# Patient Record
Sex: Female | Born: 1990
Health system: Southern US, Community
[De-identification: ages and names within clinical notes are randomized; demographics above are authoritative.]

## PROBLEM LIST (undated history)

## (undated) DIAGNOSIS — I10 Essential (primary) hypertension: Secondary | ICD-10-CM

## (undated) DIAGNOSIS — J309 Allergic rhinitis, unspecified: Secondary | ICD-10-CM

## (undated) DIAGNOSIS — J329 Chronic sinusitis, unspecified: Secondary | ICD-10-CM

## (undated) DIAGNOSIS — F99 Mental disorder, not otherwise specified: Secondary | ICD-10-CM

## (undated) DIAGNOSIS — R519 Headache, unspecified: Secondary | ICD-10-CM

## (undated) DIAGNOSIS — J45909 Unspecified asthma, uncomplicated: Secondary | ICD-10-CM

## (undated) DIAGNOSIS — F909 Attention-deficit hyperactivity disorder, unspecified type: Secondary | ICD-10-CM

## (undated) DIAGNOSIS — F419 Anxiety disorder, unspecified: Secondary | ICD-10-CM

## (undated) HISTORY — DX: Essential (primary) hypertension: I10

## (undated) HISTORY — DX: Allergic rhinitis, unspecified: J30.9

## (undated) HISTORY — DX: Unspecified asthma, uncomplicated: J45.909

## (undated) HISTORY — PX: TOOTH EXTRACTION: SUR596

## (undated) HISTORY — PX: OVARIAN CYST REMOVAL: SHX89

## (undated) HISTORY — DX: Headache, unspecified: R51.9

## (undated) HISTORY — DX: Chronic sinusitis, unspecified: J32.9

## (undated) HISTORY — DX: Mental disorder, not otherwise specified: F99

## (undated) HISTORY — PX: OTHER SURGICAL HISTORY: SHX169

---
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
## 2015-03-05 ENCOUNTER — Emergency Department (HOSPITAL_COMMUNITY)
Admission: EM | Admit: 2015-03-05 | Discharge: 2015-03-06 | Disposition: A | Discharge status: Home / Self Care | Payer: 59 | Attending: Emergency Medicine | Admitting: Emergency Medicine

## 2015-03-05 ENCOUNTER — Emergency Department (HOSPITAL_COMMUNITY): Payer: 59

## 2015-03-05 ENCOUNTER — Encounter (HOSPITAL_COMMUNITY): Payer: Self-pay

## 2015-03-05 DIAGNOSIS — S99912A Unspecified injury of left ankle, initial encounter: Secondary | ICD-10-CM | POA: Diagnosis not present

## 2015-03-05 DIAGNOSIS — Y9389 Activity, other specified: Secondary | ICD-10-CM | POA: Insufficient documentation

## 2015-03-05 DIAGNOSIS — Y998 Other external cause status: Secondary | ICD-10-CM | POA: Insufficient documentation

## 2015-03-05 DIAGNOSIS — T1490XA Injury, unspecified, initial encounter: Secondary | ICD-10-CM

## 2015-03-05 DIAGNOSIS — S199XXA Unspecified injury of neck, initial encounter: Secondary | ICD-10-CM | POA: Insufficient documentation

## 2015-03-05 DIAGNOSIS — S8991XA Unspecified injury of right lower leg, initial encounter: Secondary | ICD-10-CM | POA: Diagnosis not present

## 2015-03-05 DIAGNOSIS — S29001A Unspecified injury of muscle and tendon of front wall of thorax, initial encounter: Secondary | ICD-10-CM | POA: Diagnosis not present

## 2015-03-05 DIAGNOSIS — Y92410 Unspecified street and highway as the place of occurrence of the external cause: Secondary | ICD-10-CM | POA: Insufficient documentation

## 2015-03-05 DIAGNOSIS — R52 Pain, unspecified: Secondary | ICD-10-CM

## 2015-03-05 DIAGNOSIS — S3991XA Unspecified injury of abdomen, initial encounter: Secondary | ICD-10-CM | POA: Diagnosis not present

## 2015-03-05 LAB — COMPREHENSIVE METABOLIC PANEL
ALT: 31 U/L (ref 0–35)
AST: 29 U/L (ref 0–37)
Albumin: 3.9 g/dL (ref 3.5–5.2)
Alkaline Phosphatase: 72 U/L (ref 39–117)
Anion gap: 7 (ref 5–15)
BUN: 12 mg/dL (ref 6–23)
CO2: 25 mmol/L (ref 19–32)
Calcium: 8.9 mg/dL (ref 8.4–10.5)
Chloride: 106 mmol/L (ref 96–112)
Creatinine, Ser: 0.67 mg/dL (ref 0.50–1.10)
GFR calc Af Amer: >90 mL/min (ref >90)
GFR calc non Af Amer: >90 mL/min (ref >90)
Glucose, Bld: 112 mg/dL — ABNORMAL HIGH (ref 70–99)
Potassium: 3.5 mmol/L (ref 3.5–5.1)
Sodium: 138 mmol/L (ref 135–145)
Total Bilirubin: 0.6 mg/dL (ref 0.3–1.2)
Total Protein: 7.6 g/dL (ref 6.0–8.3)

## 2015-03-05 LAB — CBC
HCT: 39.4 % (ref 36.0–46.0)
Hemoglobin: 12.9 g/dL (ref 12.0–15.0)
MCH: 28.4 pg (ref 26.0–34.0)
MCHC: 32.7 g/dL (ref 30.0–36.0)
MCV: 86.8 fL (ref 78.0–100.0)
Platelets: 153 10*3/uL (ref 150–400)
RBC: 4.54 MIL/uL (ref 3.87–5.11)
RDW: 13.1 % (ref 11.5–15.5)
WBC: 10.9 10*3/uL — ABNORMAL HIGH (ref 4.0–10.5)

## 2015-03-05 LAB — PROTIME-INR
INR: 1.03 (ref 0.00–1.49)
Prothrombin Time: 13.6 seconds (ref 11.6–15.2)

## 2015-03-05 LAB — I-STAT BETA HCG BLOOD, ED (MC, WL, AP ONLY): I-stat hCG, quantitative: <5 m[IU]/mL (ref <5)

## 2015-03-05 LAB — SAMPLE TO BLOOD BANK

## 2015-03-05 LAB — ETHANOL: Alcohol, Ethyl (B): <5 mg/dL (ref 0–9)

## 2015-03-05 IMAGING — CR DG KNEE COMPLETE 4+V*R*
4 series · 4 of 4 positions shown · non-contrast
Comparison: None.

CLINICAL DATA: Medial knee pain

EXAM:
RIGHT KNEE - COMPLETE 4+ VIEW

[t knee ap right]
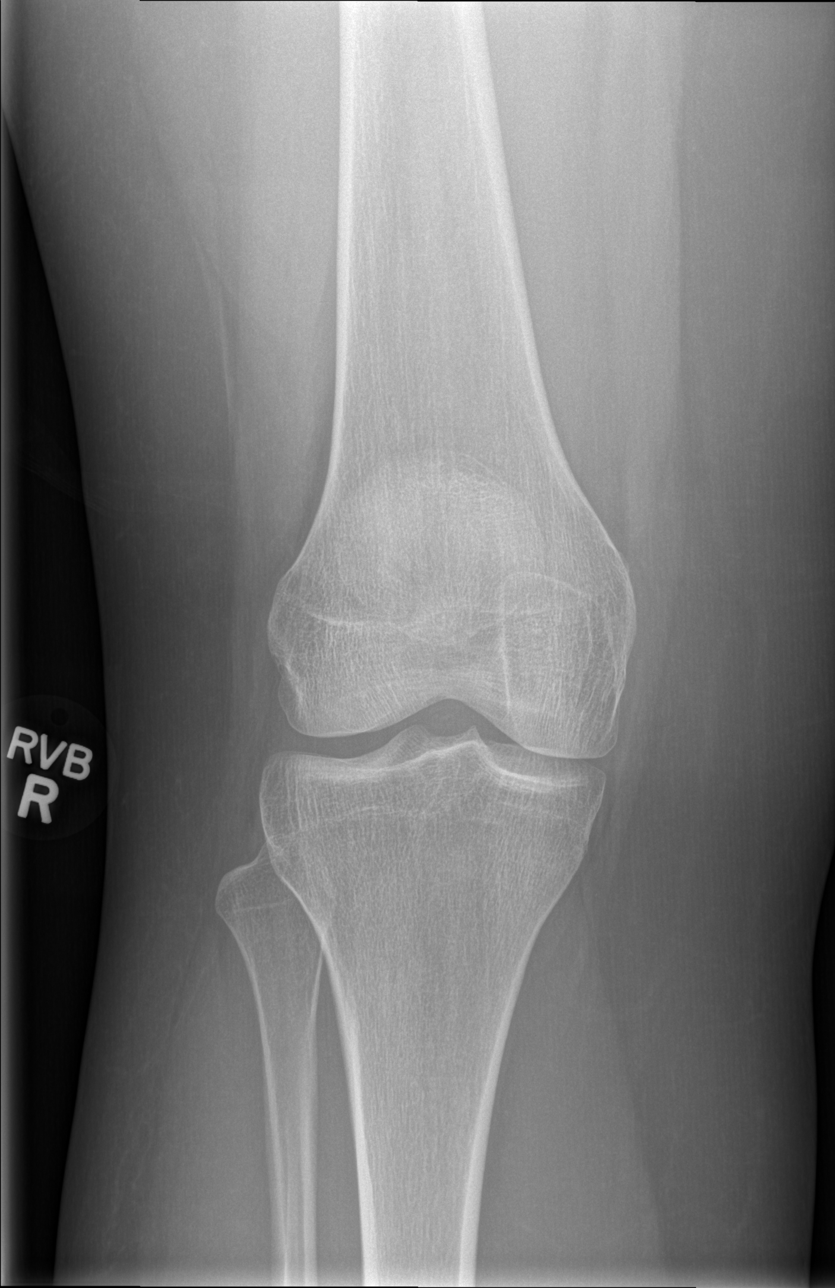

[t knee obl right (1 of 2)]
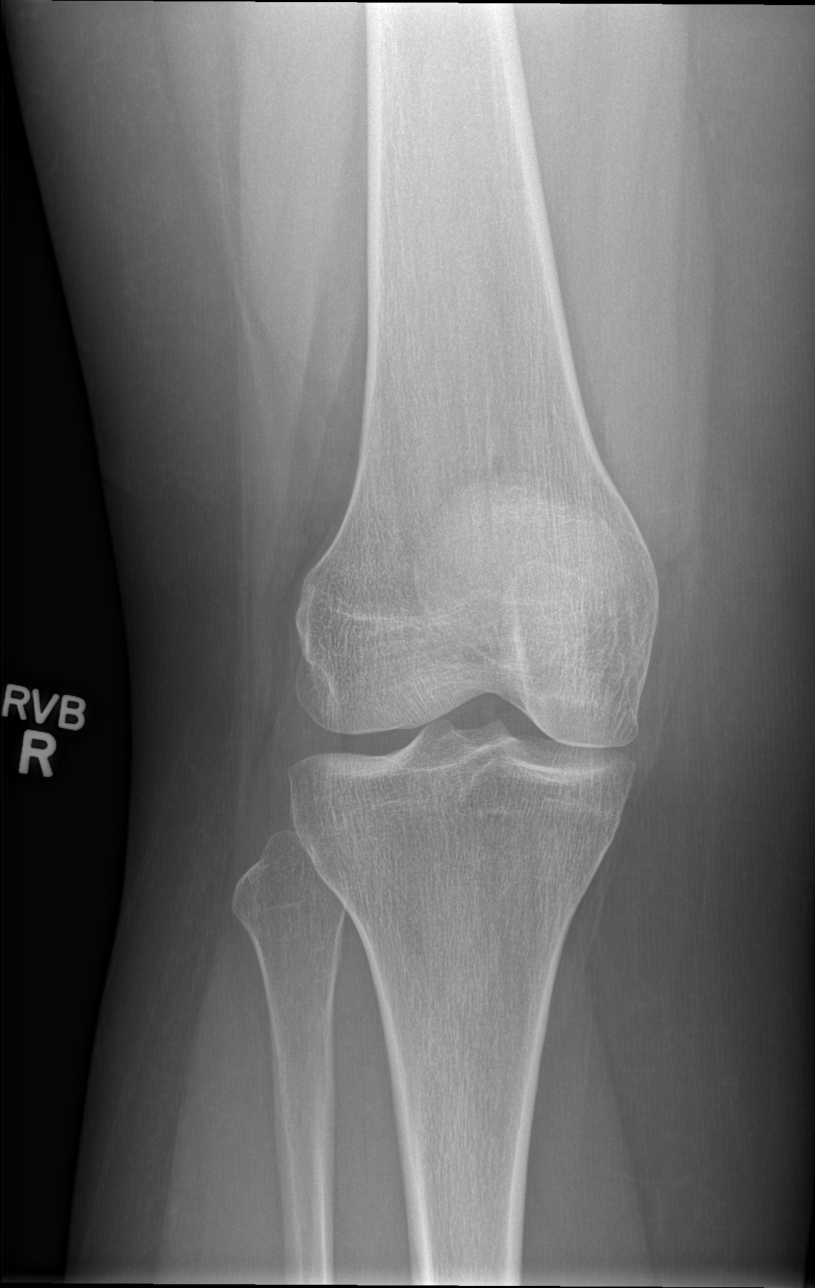

[t knee obl right (2 of 2)]
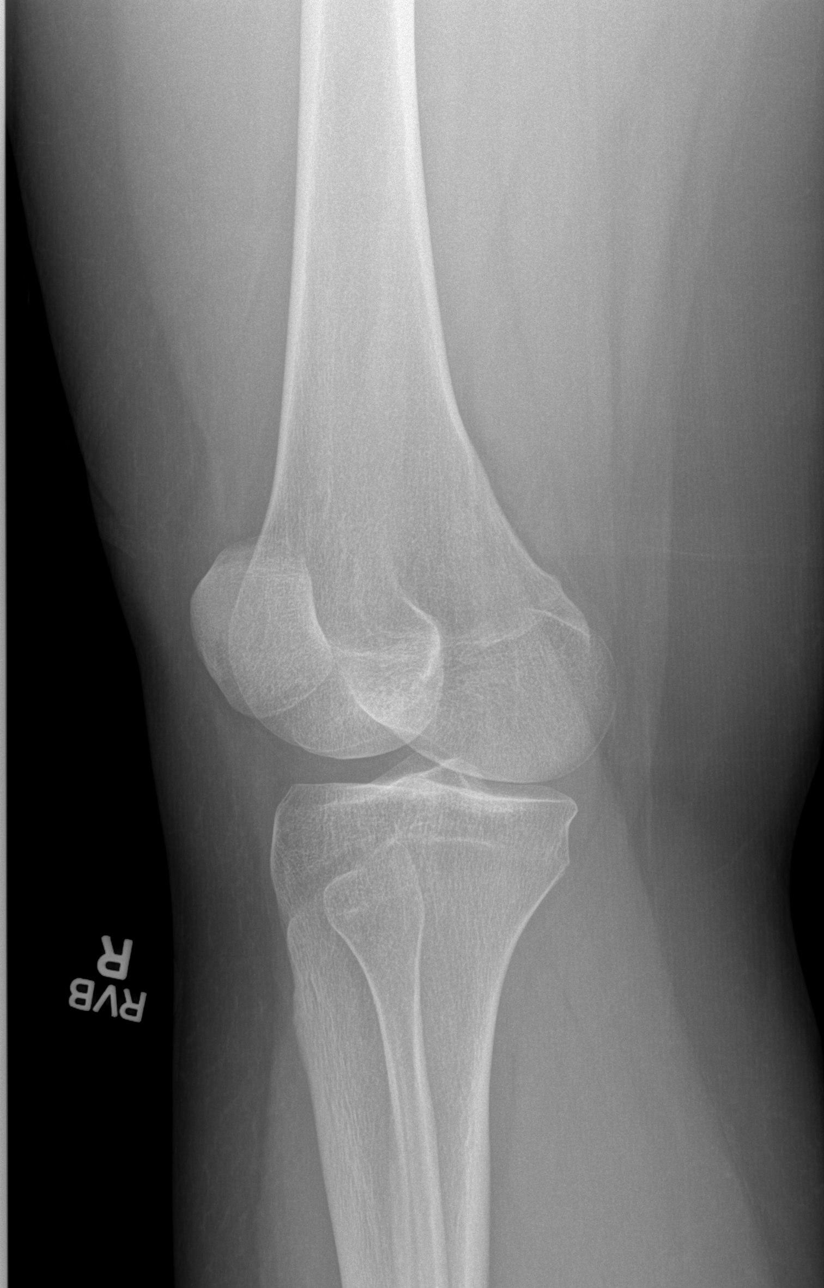

[t knee lat right]
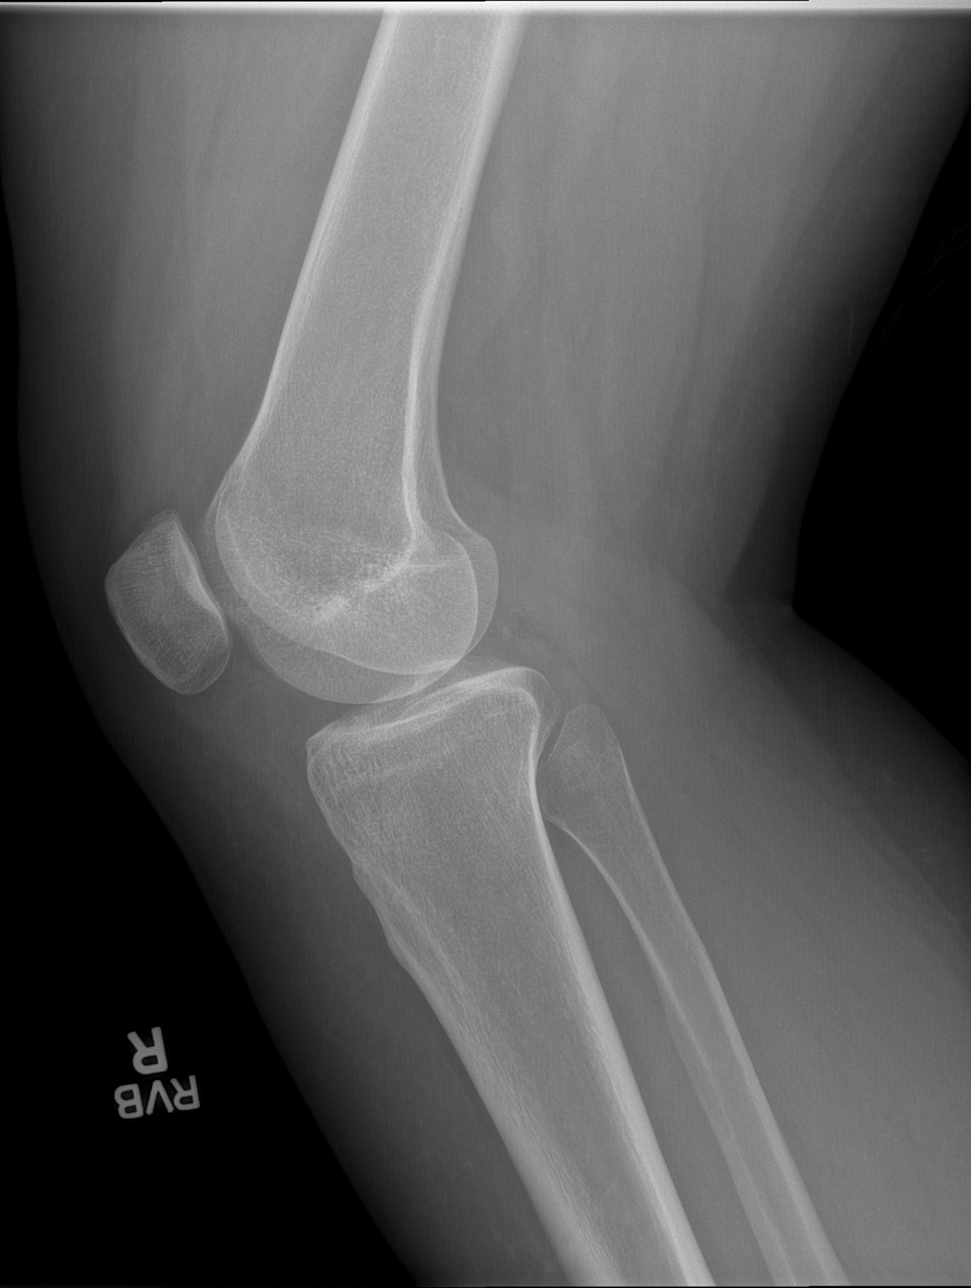

[4 of 4 positions shown; findings below may reference images not displayed]

FINDINGS: There is no evidence of fracture, dislocation, or joint effusion.
There is no evidence of arthropathy or other focal bone abnormality.
Soft tissues are unremarkable.
IMPRESSION: Negative.
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
## 2015-03-05 MED ORDER — IOHEXOL 300 MG/ML  SOLN
100.0000 mL | Freq: Once | INTRAMUSCULAR | Status: AC | PRN
  Administered 2015-03-05: 100 mL via INTRAVENOUS

## 2015-03-05 MED ORDER — HYDROMORPHONE HCL 1 MG/ML IJ SOLN
1.0000 mg | Freq: Once | INTRAMUSCULAR | Status: AC
  Administered 2015-03-05: 1 mg via INTRAVENOUS
  Filled 2015-03-05: qty 1
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
## 2015-03-05 NOTE — ED Notes (Signed)
Inquired as to why CTs have not been completed. CT tech states she was waiting on labs, labs has results and tech has been updated. Pt has been moved to front of the list.

## 2015-03-05 NOTE — ED Notes (Signed)
Patient transported to X-ray 

## 2015-03-06 MED ORDER — HYDROMORPHONE HCL 1 MG/ML IJ SOLN
1.0000 mg | Freq: Once | INTRAMUSCULAR | Status: AC
  Administered 2015-03-06: 1 mg via INTRAVENOUS
  Filled 2015-03-06: qty 1

## 2015-03-06 MED ORDER — CYCLOBENZAPRINE HCL 10 MG PO TABS: 10.0000 mg | ORAL_TABLET | Freq: Two times a day (BID) | ORAL | Status: DC | PRN

## 2015-03-06 MED ORDER — OXYCODONE-ACETAMINOPHEN 5-325 MG PO TABS: 2.0000 | ORAL_TABLET | Freq: Four times a day (QID) | ORAL | Status: DC | PRN

## 2015-03-06 NOTE — Discharge Instructions (Signed)

## 2016-02-20 ENCOUNTER — Other Ambulatory Visit (HOSPITAL_COMMUNITY): Payer: Self-pay | Admitting: Registered Nurse

## 2016-02-20 ENCOUNTER — Ambulatory Visit (HOSPITAL_COMMUNITY)
Admission: RE | Admit: 2016-02-20 | Discharge: 2016-02-20 | Disposition: A | Discharge status: Home / Self Care | Payer: 59 | Source: Ambulatory Visit | Attending: Registered Nurse | Admitting: Registered Nurse

## 2016-02-20 DIAGNOSIS — E6609 Other obesity due to excess calories: Secondary | ICD-10-CM | POA: Insufficient documentation

## 2016-02-20 DIAGNOSIS — M545 Low back pain, unspecified: Secondary | ICD-10-CM

## 2016-02-20 DIAGNOSIS — M542 Cervicalgia: Secondary | ICD-10-CM

## 2016-02-20 DIAGNOSIS — Z6831 Body mass index (BMI) 31.0-31.9, adult: Secondary | ICD-10-CM | POA: Insufficient documentation

## 2016-02-20 DIAGNOSIS — Z1389 Encounter for screening for other disorder: Secondary | ICD-10-CM | POA: Insufficient documentation

## 2016-02-20 DIAGNOSIS — G8929 Other chronic pain: Secondary | ICD-10-CM | POA: Diagnosis not present
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
## 2016-02-20 NOTE — Progress Notes (Signed)
Pt removed 2 earrings, 1 belly ring, and 1 tongue ring for imaging. Jewelry was placed in a Ziploc and Pt placed bag in her purse.

## 2016-03-18 DIAGNOSIS — Z1389 Encounter for screening for other disorder: Secondary | ICD-10-CM | POA: Diagnosis not present

## 2016-03-18 DIAGNOSIS — N342 Other urethritis: Secondary | ICD-10-CM | POA: Diagnosis not present

## 2016-03-18 DIAGNOSIS — B079 Viral wart, unspecified: Secondary | ICD-10-CM | POA: Diagnosis not present

## 2016-03-18 DIAGNOSIS — Z6832 Body mass index (BMI) 32.0-32.9, adult: Secondary | ICD-10-CM | POA: Diagnosis not present

## 2016-03-31 ENCOUNTER — Encounter: Payer: Self-pay | Admitting: Neurology

## 2016-03-31 ENCOUNTER — Ambulatory Visit (INDEPENDENT_AMBULATORY_CARE_PROVIDER_SITE_OTHER): Payer: 59 | Admitting: Neurology

## 2016-03-31 VITALS — BP 122/74 | HR 100 | Ht 66.0 in | Wt 204.3 lb

## 2016-03-31 DIAGNOSIS — M545 Low back pain, unspecified: Secondary | ICD-10-CM

## 2016-03-31 DIAGNOSIS — R51 Headache: Secondary | ICD-10-CM | POA: Diagnosis not present

## 2016-03-31 DIAGNOSIS — M542 Cervicalgia: Secondary | ICD-10-CM | POA: Diagnosis not present

## 2016-03-31 DIAGNOSIS — G4486 Cervicogenic headache: Secondary | ICD-10-CM

## 2016-03-31 MED ORDER — NORTRIPTYLINE HCL 10 MG PO CAPS: 10.0000 mg | ORAL_CAPSULE | Freq: Every day | ORAL | Status: DC

## 2016-03-31 NOTE — Patient Instructions (Signed)
1. For headaches, will start nortriptyline 10mg  at bedtime.  Call in 4 weeks with update and we can increase dose if needed. 2.  Limit use of ibuprofen or any pain relievers to no more than 2 days out of the week if possible to prevent rebound headache. 3.  Continue cyclobenzaprine 4.  Stop hydroxyzine. 5.  For neck and back pain, will refer you to Dr. Antoine Primas of Sports Medicine. 6.  Follow up in 3 months

## 2016-03-31 NOTE — Progress Notes (Signed)
Chart forwarded.  

## 2016-03-31 NOTE — Progress Notes (Signed)
NEUROLOGY CONSULTATION NOTE  Elizabeth Frank MRN: [id number] DOB: 11-Dec-1991  Referring provider: Dr. Sherwood Gambler Primary care provider: Dr. Sherwood Gambler  Reason for consult:  Back pain  HISTORY OF PRESENT ILLNESS: Elizabeth Frank is a 25 year old right-handed female who presents for back pain.  History obtained by patient and PCP note.  Imaging of cervical and lumbar radiographs reviewed.  She has longstanding history of low back pain.  Occasionally, it may radiate down the back of one of her legs, but usually it is midline.  Since January, she had low back pain that radiates up her spine and into her neck.  The pain is most prominent in the low back and at around the C7 level.  There is no numbness, weakness, or radicular pain in the extremities.  There is no bowel or bladder dysfunction, although she has an upcoming appointment with a urologist for frequent UTIs and urinary frequency.  The neck and back pain seem worse with prolonged inactivity, such as with standing, laying down or sitting.  Using a heating pad, ibuprofen or cyclobenzaprine helps.  When she gets the neck pain, she develops a headache radiating from the back of her head up to the front, bilaterally.  It is usually an ache but sometimes throbbing.  When severe, there may be photophobia or phonophobia but no nausea or visual disturbance.  It is about 6-7/10 intensity.  It lasts a couple of hours and occurs 4 to 5 times a week.  X-rays of the cervical and lumbar spines from 02/21/16 were normal.  She was prescribed a Medrol dose pack which ws ineffective.  Currently, he takes cyclobenzaprine 10mg  twice daily as needed and ibuprofen 800mg  three times daily as needed.  PAST MEDICAL HISTORY: History reviewed. No pertinent past medical history.  PAST SURGICAL HISTORY: History reviewed. No pertinent past surgical history.  MEDICATIONS: Current Outpatient Prescriptions on File Prior to Visit  Medication Sig Dispense Refill  . Cholecalciferol  (VITAMIN D PO) Take 1 tablet by mouth daily.     No current facility-administered medications on file prior to visit.    ALLERGIES: No Known Allergies  FAMILY HISTORY: History reviewed. No pertinent family history.  SOCIAL HISTORY: Social History   Social History  . Marital Status: Single    Spouse Name: N/A  . Number of Children: N/A  . Years of Education: N/A   Occupational History  . Not on file.   Social History Main Topics  . Smoking status: Never Smoker   . Smokeless tobacco: Not on file  . Alcohol Use: No  . Drug Use: No  . Sexual Activity: Not on file   Other Topics Concern  . Not on file   Social History Narrative    REVIEW OF SYSTEMS: Constitutional: No fevers, chills, or sweats, no generalized fatigue, change in appetite Eyes: No visual changes, double vision, eye pain Ear, nose and throat: No hearing loss, ear pain, nasal congestion, sore throat Cardiovascular: No chest pain, palpitations Respiratory:  No shortness of breath at rest or with exertion, wheezes GastrointestinaI: No nausea, vomiting, diarrhea, abdominal pain, fecal incontinence Genitourinary:  No dysuria, urinary retention or frequency Musculoskeletal:  No neck pain, back pain Integumentary: No rash, pruritus, skin lesions Neurological: as above Psychiatric: No depression, insomnia, anxiety Endocrine: No palpitations, fatigue, diaphoresis, mood swings, change in appetite, change in weight, increased thirst Hematologic/Lymphatic:  No anemia, purpura, petechiae. Allergic/Immunologic: no itchy/runny eyes, nasal congestion, recent allergic reactions, rashes  PHYSICAL EXAM: Filed Vitals:  03/31/16 1432  BP: 122/74  Pulse: 100   General: No acute distress.  Patient appears well-groomed.  Head:  Normocephalic/atraumatic Eyes:  fundi unremarkable, without vessel changes, exudates, hemorrhages or papilledema. Neck: supple, no paraspinal tenderness, mild tenderness to palpation of occipital  region bilaterally and in trapezius, full range of motion Back: bilateral paraspinal tenderness down the spine. Heart: regular rate and rhythm Lungs: Clear to auscultation bilaterally. Vascular: No carotid bruits. Neurological Exam: Mental status: alert and oriented to person, place, and time, recent and remote memory intact, fund of knowledge intact, attention and concentration intact, speech fluent and not dysarthric, language intact. Cranial nerves: CN I: not tested CN II: pupils equal, round and reactive to light, visual fields intact, fundi unremarkable, without vessel changes, exudates, hemorrhages or papilledema. CN III, IV, VI:  full range of motion, no nystagmus, no ptosis CN V: facial sensation intact CN VII: upper and lower face symmetric CN VIII: hearing intact CN IX, X: gag intact, uvula midline CN XI: sternocleidomastoid and trapezius muscles intact CN XII: tongue midline Bulk & Tone: normal, no fasciculations. Motor:  5/5 throughout  Sensation: temperature and vibration sensation intact. Deep Tendon Reflexes:  2+ throughout, toes downgoing.  Finger to nose testing:  Without dysmetria.  Heel to shin:  Without dysmetria.  Gait:  Normal station and stride.  Able to turn and tandem walk. Romberg negative.  IMPRESSION: 1.  Neck and back pain.  It does not appear neurologic, although low back pain occasionally associated with lumbar radiculopathy. 2.  Cervicogenic or tension-type headache, related to neck pain.  PLAN: 1. For frequent headaches, will start nortriptyline  at bedtime.  She will contact us in 4 weeks with update and we can adjust dose if needed. 2.  Advised to limit use of ibuprofen or all pain relievers to no more than 2 days out of the week to prevent rebound headache 3.  Cyclobenzaprine as needed 4.  I would like to refer her to Dr. Antoine Primas of Sports Medicine to evaluate and treat neck and back pain, which is the probable generator for her  headaches. 5.  She will stop Atarax as it is ineffective. 6.  Follow up in 3 months.  Thank you for allowing me to take part in the care of this patient.  Shon Millet, DO  CC:  Elfredia Nevins, MD

## 2016-04-27 ENCOUNTER — Telehealth: Payer: Self-pay | Admitting: Neurology

## 2016-04-27 NOTE — Telephone Encounter (Signed)
PT called and said she is still having headaches and also asking about a referral to sports medicine/Dawn CB# [phone]

## 2016-04-28 MED ORDER — NORTRIPTYLINE HCL 25 MG PO CAPS: 25.0000 mg | ORAL_CAPSULE | Freq: Every day | ORAL | Status: DC

## 2016-04-28 NOTE — Telephone Encounter (Signed)
Dr. Michaelle Copas contact number is [phone]

## 2016-04-28 NOTE — Telephone Encounter (Signed)
New RX sent in. Spoke w/ patient

## 2016-04-28 NOTE — Telephone Encounter (Signed)
Pt on nortriptyline10 mg. States she really can't tell too much of a difference with her headaches.  She is having no side effects from medication. It is however helping her mood. Pt would like to know if it can be increased. Please advise.

## 2016-04-28 NOTE — Telephone Encounter (Signed)
Yes, increase to  at bedtime and contact us in 4 weeks with update.

## 2016-05-21 DIAGNOSIS — Z6832 Body mass index (BMI) 32.0-32.9, adult: Secondary | ICD-10-CM | POA: Diagnosis not present

## 2016-05-21 DIAGNOSIS — J3089 Other allergic rhinitis: Secondary | ICD-10-CM | POA: Diagnosis not present

## 2016-05-21 DIAGNOSIS — B079 Viral wart, unspecified: Secondary | ICD-10-CM | POA: Diagnosis not present

## 2016-05-28 ENCOUNTER — Telehealth: Payer: Self-pay | Admitting: Neurology

## 2016-05-28 NOTE — Telephone Encounter (Signed)
Elizabeth Frank 07-15-1991. She was calling to update Dr. Everlena Cooper on her headaches. They are much better. Her back pain is also better. She said she still has some headaches, but no where near as bad as they were. Her number is [phone]. Thank you

## 2016-05-29 MED ORDER — NORTRIPTYLINE HCL 25 MG PO CAPS: 25.0000 mg | ORAL_CAPSULE | Freq: Every day | ORAL | Status: DC

## 2016-06-30 ENCOUNTER — Ambulatory Visit: Payer: BC Managed Care – PPO | Admitting: Neurology

## 2016-06-30 DIAGNOSIS — Z029 Encounter for administrative examinations, unspecified: Secondary | ICD-10-CM

## 2016-07-21 DIAGNOSIS — J343 Hypertrophy of nasal turbinates: Secondary | ICD-10-CM | POA: Diagnosis not present

## 2016-07-21 DIAGNOSIS — F419 Anxiety disorder, unspecified: Secondary | ICD-10-CM | POA: Diagnosis not present

## 2016-07-21 DIAGNOSIS — Z6832 Body mass index (BMI) 32.0-32.9, adult: Secondary | ICD-10-CM | POA: Diagnosis not present

## 2016-07-21 DIAGNOSIS — J811 Chronic pulmonary edema: Secondary | ICD-10-CM | POA: Diagnosis not present

## 2016-08-26 ENCOUNTER — Encounter: Payer: Self-pay | Admitting: Neurology

## 2016-08-26 ENCOUNTER — Ambulatory Visit (INDEPENDENT_AMBULATORY_CARE_PROVIDER_SITE_OTHER): Payer: BC Managed Care – PPO | Admitting: Neurology

## 2016-08-26 VITALS — BP 116/80 | HR 103 | Ht 66.0 in | Wt 205.9 lb

## 2016-08-26 DIAGNOSIS — G44219 Episodic tension-type headache, not intractable: Secondary | ICD-10-CM

## 2016-08-26 NOTE — Progress Notes (Signed)
Chart forwarded.  

## 2016-08-26 NOTE — Patient Instructions (Signed)
Increased headaches likely related to going back to school/stress 1. Continue the nortriptyline 25mg  at bedtime for now.  If they don't improve, contact me and we can increase the nortriptyline.  2.  Limit use of ibuprofen to no more than 2 days out of the week 3.  Hydrate 4.  Exercise 5.  Follow up in 5 months.

## 2016-08-26 NOTE — Progress Notes (Signed)
NEUROLOGY FOLLOW UP OFFICE NOTE  Elizabeth M Waterhouse [id number]  HISTORY OF PRESENT ILLNESS: Elizabeth Frank is a 25 year old right-handed female who follows up for headache and neck and back pain.  UPDATE: For headache, she is taking nortriptyline 25mg  at bedtime.  Headaches improved significantly.  They usually respond to ibuprofen quickly and were occurring about 2 days per week.  Neck pain improved with the headaches as well.  She does stretching exercises  She was referred to Dr. Katrinka Blazing for OMM, but they had improved, so she didn't need to make the appointment.  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  Since then, the headaches have increased in frequency, now about 3 to 4 days per week.    She sleeps well.  She walks and stretches.  She tries to keep hydrated.  HISTORY: She has longstanding history of low back pain.  Occasionally, it may radiate down the back of one of her legs, but usually it is midline.  Since January, she had low back pain that radiates up her spine and into her neck.  The pain is most prominent in the low back and at around the C7 level.  There is no numbness, weakness, or radicular pain in the extremities.  There is no bowel or bladder dysfunction, although she has an upcoming appointment with a urologist for frequent UTIs and urinary frequency.  The neck and back pain seem worse with prolonged inactivity, such as with standing, laying down or sitting.  Using a heating pad, ibuprofen or cyclobenzaprine helps.  When she gets the neck pain, she develops a headache radiating from the back of her head up to the front, bilaterally.  It is usually an ache but sometimes throbbing.  When severe, there may be photophobia or phonophobia but no nausea or visual disturbance.  It is about 6-7/10 intensity.  It lasts a couple of hours and occurs 4 to 5 times a week.  X-rays of the cervical and lumbar spines from 02/21/16 were normal.  PAST MEDICAL HISTORY: No past medical  history on file.  MEDICATIONS: Current Outpatient Prescriptions on File Prior to Visit  Medication Sig Dispense Refill  . ibuprofen (ADVIL,MOTRIN) 800 MG tablet Take 800 mg by mouth every 8 (eight) hours as needed.    . nortriptyline (PAMELOR) 25 MG capsule Take 1 capsule (25 mg total) by mouth at bedtime. 30 capsule 1  . Cholecalciferol (VITAMIN D PO) Take 1 tablet by mouth daily.    . cyclobenzaprine (FLEXERIL) 10 MG tablet Take 10 mg by mouth 3 (three) times daily as needed for muscle spasms.    . hydrOXYzine (ATARAX/VISTARIL) 25 MG tablet Take 25 mg by mouth 3 (three) times daily as needed.     No current facility-administered medications on file prior to visit.     ALLERGIES: No Known Allergies  FAMILY HISTORY: No family history noted.  SOCIAL HISTORY: Social History   Social History  . Marital status: Single    Spouse name: N/A  . Number of children: N/A  . Years of education: N/A   Occupational History  . Not on file.   Social History Main Topics  . Smoking status: Never Smoker  . Smokeless tobacco: Not on file  . Alcohol use No  . Drug use: No  . Sexual activity: Not on file   Other Topics Concern  . Not on file   Social History Narrative  . No narrative on file  REVIEW OF SYSTEMS: Constitutional: No fevers, chills, or sweats, no generalized fatigue, change in appetite Eyes: No visual changes, double vision, eye pain Ear, nose and throat: No hearing loss, ear pain, nasal congestion, sore throat Cardiovascular: No chest pain, palpitations Respiratory:  No shortness of breath at rest or with exertion, wheezes GastrointestinaI: No nausea, vomiting, diarrhea, abdominal pain, fecal incontinence Genitourinary:  No dysuria, urinary retention or frequency Musculoskeletal:  No neck pain, back pain Integumentary: No rash, pruritus, skin lesions Neurological: as above Psychiatric: No depression, insomnia, anxiety Endocrine: No palpitations, fatigue,  diaphoresis, mood swings, change in appetite, change in weight, increased thirst Hematologic/Lymphatic:  No purpura, petechiae. Allergic/Immunologic: no itchy/runny eyes, nasal congestion, recent allergic reactions, rashes  PHYSICAL EXAM: Vitals:   08/26/16 1426  BP: 116/80  Pulse: (!) 103   General: No acute distress.  Patient appears well-groomed.  Head:  Normocephalic/atraumatic Eyes:  Fundi examined but not visualized Neck: supple, no paraspinal tenderness, full range of motion Heart:  Regular rate and rhythm Lungs:  Clear to auscultation bilaterally Back: No paraspinal tenderness Neurological Exam: alert and oriented to person, place, and time. Attention span and concentration intact, recent and remote memory intact, fund of knowledge intact.  Speech fluent and not dysarthric, language intact.  CN II-XII intact. Bulk and tone normal, muscle strength 5/5 throughout.  Sensation to light touch, temperature and vibration intact.  Deep tendon reflexes 2+ throughout, toes downgoing.  Finger to nose and heel to shin testing intact.  Gait normal, Romberg negative.  IMPRESSION: Tension type headache.  Recently increased frequency due to stress from going back to school.  PLAN: 1.  She will continue nortriptyline 25mg  at bedtime for now.  If headaches don't improve, she will contact us and we can increase dose to 50mg  at bedtime. 2.  Advised to limit use of ibuprofen to no more than 2 days out of the week 3.  Stay hydrated, exercise 4.  Follow up in 5 months.  15 minutes spent face to face with patient, over 50% spent counseling.  Shon MilletAdam Shaday Rayborn, DO  CC:  Elfredia NevinsLawrence Fusco, MD

## 2016-09-04 ENCOUNTER — Telehealth: Payer: Self-pay

## 2016-09-04 MED ORDER — NORTRIPTYLINE HCL 50 MG PO CAPS: 50.0000 mg | ORAL_CAPSULE | Freq: Every day | ORAL | 3 refills | Status: DC

## 2016-09-04 NOTE — Telephone Encounter (Signed)
1.  She will continue nortriptyline 25mg  at bedtime for now.  If headaches don't improve, she will contact us and we can increase dose to 50mg  at bedtime.

## 2016-09-24 ENCOUNTER — Other Ambulatory Visit: Payer: Self-pay

## 2016-09-24 MED ORDER — NORTRIPTYLINE HCL 50 MG PO CAPS: 50.0000 mg | ORAL_CAPSULE | Freq: Every day | ORAL | 1 refills | Status: DC

## 2016-09-24 NOTE — Telephone Encounter (Signed)
Pt called to switch pharmacy to mail order. New Rx sent in.

## 2016-11-06 DIAGNOSIS — J343 Hypertrophy of nasal turbinates: Secondary | ICD-10-CM | POA: Diagnosis not present

## 2016-11-06 DIAGNOSIS — J029 Acute pharyngitis, unspecified: Secondary | ICD-10-CM | POA: Diagnosis not present

## 2016-11-06 DIAGNOSIS — Z6836 Body mass index (BMI) 36.0-36.9, adult: Secondary | ICD-10-CM | POA: Diagnosis not present

## 2016-11-06 DIAGNOSIS — Z1389 Encounter for screening for other disorder: Secondary | ICD-10-CM | POA: Diagnosis not present

## 2016-11-06 DIAGNOSIS — Z0001 Encounter for general adult medical examination with abnormal findings: Secondary | ICD-10-CM | POA: Diagnosis not present

## 2016-11-06 DIAGNOSIS — J020 Streptococcal pharyngitis: Secondary | ICD-10-CM | POA: Diagnosis not present

## 2016-11-06 DIAGNOSIS — E6609 Other obesity due to excess calories: Secondary | ICD-10-CM | POA: Diagnosis not present

## 2017-01-14 DIAGNOSIS — J9801 Acute bronchospasm: Secondary | ICD-10-CM | POA: Diagnosis not present

## 2017-01-14 DIAGNOSIS — E6609 Other obesity due to excess calories: Secondary | ICD-10-CM | POA: Diagnosis not present

## 2017-01-14 DIAGNOSIS — Z6835 Body mass index (BMI) 35.0-35.9, adult: Secondary | ICD-10-CM | POA: Diagnosis not present

## 2017-01-14 DIAGNOSIS — Z1389 Encounter for screening for other disorder: Secondary | ICD-10-CM | POA: Diagnosis not present

## 2017-01-14 DIAGNOSIS — J329 Chronic sinusitis, unspecified: Secondary | ICD-10-CM | POA: Diagnosis not present

## 2017-01-14 DIAGNOSIS — J209 Acute bronchitis, unspecified: Secondary | ICD-10-CM | POA: Diagnosis not present

## 2017-01-26 ENCOUNTER — Ambulatory Visit: Payer: BC Managed Care – PPO | Admitting: Neurology

## 2017-01-28 ENCOUNTER — Encounter: Payer: Self-pay | Admitting: Neurology

## 2017-07-12 ENCOUNTER — Other Ambulatory Visit: Payer: Self-pay | Admitting: Neurology

## 2017-07-14 ENCOUNTER — Telehealth: Payer: Self-pay | Admitting: Neurology

## 2017-07-14 ENCOUNTER — Other Ambulatory Visit: Payer: Self-pay | Admitting: *Deleted

## 2017-07-14 MED ORDER — NORTRIPTYLINE HCL 50 MG PO CAPS: 50.0000 mg | ORAL_CAPSULE | Freq: Every day | ORAL | 0 refills | Status: DC

## 2017-07-14 NOTE — Telephone Encounter (Signed)
Rx sent for a 30 day supply.  No refills until after patient is seen in office.

## 2017-07-14 NOTE — Telephone Encounter (Signed)
PT called and said she needs a refill if her nortriptyline called in

## 2017-07-15 ENCOUNTER — Encounter: Payer: Self-pay | Admitting: Neurology

## 2017-07-15 ENCOUNTER — Ambulatory Visit (INDEPENDENT_AMBULATORY_CARE_PROVIDER_SITE_OTHER): Payer: BC Managed Care – PPO | Admitting: Neurology

## 2017-07-15 VITALS — BP 110/68 | HR 98 | Ht 66.0 in | Wt 234.0 lb

## 2017-07-15 DIAGNOSIS — G44219 Episodic tension-type headache, not intractable: Secondary | ICD-10-CM | POA: Diagnosis not present

## 2017-07-15 NOTE — Patient Instructions (Signed)
1.  Continue nortriptyline 50mg  at bedtime 2.  Take ibuprofen as needed, limited to no more than 2 days out of the week to prevent rebound headache 3.  Increase water intake 4.  Increase exercise (strive for goal of at least 30 minutes 4 days per week) 5.  Consider riboflavin 400mg  daily (over the counter but may need to get on [employer]) and CoQ10 100mg  three times daily 6.  Follow up in one year

## 2017-07-15 NOTE — Progress Notes (Signed)
NEUROLOGY FOLLOW UP OFFICE NOTE  Elizabeth Frank [id number]  HISTORY OF PRESENT ILLNESS: Elizabeth Frank is a 26 year old right-handed female who follows up for episodic tension type headache.   UPDATE: Headaches are well-controlled. Intensity:  4-5/10 Duration:  30 minutes with ibuprofen Frequency:  4 to 5 days per month Current abortive medication:  Ibuprofen 200mg  Current preventative medication:  nortriptyline 50mg  at bedtime   She sleeps well.  She still walks and stretches but needs to improve.  She tries to keep hydrated.   HISTORY: She has longstanding history of neck and back pain.  When she gets the neck pain, she develops a headache radiating from the back of her head up to the front, bilaterally.  It is usually an ache but sometimes throbbing.  When severe, there may be photophobia or phonophobia but no nausea or visual disturbance.  It is about 6-7/10 intensity.  Initially, it lasts a couple of hours and occurs 4 to 5 times a week.  No specific triggers.  Past medication:  Ibuprofen 800mg , cyclobenzaprine   X-rays of the cervical and lumbar spines from 02/21/16 were normal..  PAST MEDICAL HISTORY: No past medical history on file.  MEDICATIONS: Current Outpatient Prescriptions on File Prior to Visit  Medication Sig Dispense Refill  . cyclobenzaprine (FLEXERIL) 10 MG tablet Take 10 mg by mouth 3 (three) times daily as needed for muscle spasms.    . nortriptyline (PAMELOR) 50 MG capsule Take 1 capsule (50 mg total) by mouth at bedtime. 30 capsule 0   No current facility-administered medications on file prior to visit.     ALLERGIES: No Known Allergies  FAMILY HISTORY: No family history on file.  SOCIAL HISTORY: Social History   Social History  . Marital status: Single    Spouse name: N/A  . Number of children: N/A  . Years of education: N/A   Occupational History  . Not on file.   Social History Main Topics  . Smoking status: Never Smoker  . Smokeless  tobacco: Never Used  . Alcohol use No  . Drug use: No  . Sexual activity: Not on file   Other Topics Concern  . Not on file   Social History Narrative  . No narrative on file    REVIEW OF SYSTEMS: Constitutional: No fevers, chills, or sweats, no generalized fatigue, change in appetite Eyes: No visual changes, double vision, eye pain Ear, nose and throat: No hearing loss, ear pain, nasal congestion, sore throat Cardiovascular: No chest pain, palpitations Respiratory:  No shortness of breath at rest or with exertion, wheezes GastrointestinaI: No nausea, vomiting, diarrhea, abdominal pain, fecal incontinence Genitourinary:  No dysuria, urinary retention or frequency Musculoskeletal:  No neck pain, back pain Integumentary: No rash, pruritus, skin lesions Neurological: as above Psychiatric: No depression, insomnia, anxiety Endocrine: No palpitations, fatigue, diaphoresis, mood swings, change in appetite, change in weight, increased thirst Hematologic/Lymphatic:  No purpura, petechiae. Allergic/Immunologic: no itchy/runny eyes, nasal congestion, recent allergic reactions, rashes  PHYSICAL EXAM: Vitals:   07/15/17 0839  BP: 110/68  Pulse: 98   General: No acute distress.  Patient appears well-groomed.   Head:  Normocephalic/atraumatic Eyes:  Fundi examined but not visualized Neck: supple, no paraspinal tenderness, full range of motion Heart:  Regular rate and rhythm Lungs:  Clear to auscultation bilaterally Back: No paraspinal tenderness Neurological Exam: alert and oriented to person, place, and time. Attention span and concentration intact, recent and remote memory intact, fund of knowledge intact.  Speech  fluent and not dysarthric, language intact.  CN II-XII intact. Bulk and tone normal, muscle strength 5/5 throughout.  Sensation to light touch, temperature and vibration intact.  Deep tendon reflexes 2+ throughout, toes downgoing.  Finger to nose and heel to shin testing intact.   Gait normal, Romberg negative.  IMPRESSION: Episodic tension type headache  PLAN: 1.  Refilled nortriptyline 50mg  at bedtime 2.  Take ibuprofen as needed, limited to no more than 2 days out of the week to prevent rebound headache 3.  Increase water intake 4.  Increase exercise (strive for goal of at least 30 minutes 4 days per week) 5.  Consider riboflavin 400mg  daily (over the counter but may need to get on [employer]) and CoQ10 100mg  three times daily 6.  Follow up in one year  If at any point she wishes to discontinue nortriptyline (as she may no longer need it), she should contact us.   Shon Millet, DO  CC:  [facility]

## 2017-08-23 ENCOUNTER — Other Ambulatory Visit: Payer: Self-pay | Admitting: Neurology

## 2018-04-15 ENCOUNTER — Encounter: Payer: Self-pay | Admitting: Neurology

## 2018-04-19 ENCOUNTER — Other Ambulatory Visit: Payer: Self-pay | Admitting: Neurology

## 2018-07-15 ENCOUNTER — Other Ambulatory Visit: Payer: Self-pay

## 2018-07-15 ENCOUNTER — Ambulatory Visit: Payer: BC Managed Care – PPO | Admitting: Neurology

## 2019-03-17 ENCOUNTER — Telehealth: Payer: Self-pay
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
## 2019-03-17 NOTE — Telephone Encounter (Signed)
Needs to be seen.  I haven't seen her in 1 1/2 years

## 2019-03-17 NOTE — Telephone Encounter (Signed)
Pt being scheduled for e-visit

## 2019-03-19 NOTE — Progress Notes (Signed)
Virtual Visit via Video Note The purpose of this virtual visit is to provide medical care while limiting exposure to the novel coronavirus.    Consent was obtained for video visit:  Yes.   Answered questions that patient had about telehealth interaction:  Yes.   I discussed the limitations, risks, security and privacy concerns of performing an evaluation and management service by telemedicine. I also discussed with the patient that there may be a patient responsible charge related to this service. The patient expressed understanding and agreed to proceed.  Pt location: Home Physician Location: office Name of referring provider:  [facility], [facility] I connected with Elizabeth Frank at patients initiation/request on 03/20/2019 at  1:00 PM EDT by video enabled telemedicine application and verified that I am speaking with the correct person using two identifiers. Pt MRN:  [id number] Pt DOB:  May 09, 1991   History of Present Illness:  Elizabeth Schiff is a 28 year old right-handed female who follows up for episodic tension type headache.  UPDATE: Patient last seen in July 2018.  Headaches had been well-controlled.  She self-discontinued nortriptyline last summer since the headaches had resolved.  In September, she had increased emotional stress after she broke up with her boyfriend.  Since then, she reports increased frequency of headaches, including migraines.  Migraines are 8-9/10 intensity and associated with blurred vision, photophobia, phonophobia and sometimes nausea (no vomiting or unilateral numbness or weakness).  They typically last all day.  Her typical headaches are 6-7/10, last 2 hours if treated but may last up to all day.  She has had 25 headache days in past 30 days, about 4 were what she calls "migraines".  She has been treating headaches with ibuprofen or Goody powder about 3 to 4 days a week.  She has had increased neck and back pain as well.  Current abortive medication:   Ibuprofen 200mg , Goody powders Current preventative medication:  none Birth control:  Tri-Estarylla  She sleeps well.  She still walks and stretches but needs to improve.  She tries to keep hydrated.  HISTORY: She has longstanding history of neck and back pain. When she gets the neck pain, she develops a headache radiating from the back of her head up to the front, bilaterally. It is usually an ache but sometimes throbbing. When severe, there may be photophobia or phonophobia but no nausea or visual disturbance. It is about 6-7/10 intensity. Initially, it lasts a couple of hours and occurs 4 to 5 times a week.  No specific triggers.  Past medication:  Ibuprofen 800mg , cyclobenzaprine  X-rays of the cervical and lumbar spines from 02/21/16 were normal..    Observations/Objective:   Weight 236 lb (107 kg). alert and oriented to person, place, and time. Attention span and concentration intact, recent and remote memory intact, fund of knowledge intact.  Speech fluent and not dysarthric, language intact.  Pupils equal and round.  Eyes orthophoric on primary gaze and move in all directions.  Face symmetric.  Facial sensation intact.  Tongue midline.  No pronator drift.  Moves all extremities against gravity.  Finger to nose testing intact.  Gait normal, Romberg negative.  Assessment and Plan:   Chronic migraine without aura, without status migrainosus, not intractable Cervicalgia Lumbago  1.  For preventative management, restart nortriptyline 25mg  at bedtime for 1 week, then increase to 50mg  at bedtime.  She will contact us in 5 weeks with update.  If not improved, increase dose to 75mg  at bedtime  2.  For abortive therapy, she may try rizatriptan 10mg .  She may use ibuprofen or Excedrin for more mild headaches. 3.  Limit use of pain relievers to no more than 2 days out of week to prevent risk of rebound or medication-overuse headache. 4. She may take tizanidine 2mg  up to 3 times daily as  needed for neck and back pain.  She was cautioned about drowsiness.  She will continue her stretches. 5.  Keep headache diary 6.  Exercise, hydration, caffeine cessation, sleep hygiene, monitor for and avoid triggers 7.  Consider:  magnesium citrate 400mg  daily, riboflavin 400mg  daily, and coenzyme Q10 100mg  three times daily 8.  Follow up in 4 months.   Follow Up Instructions:    -I discussed the assessment and treatment plan with the patient. The patient was provided an opportunity to ask questions and all were answered. The patient agreed with the plan and demonstrated an understanding of the instructions.   The patient was advised to call back or seek an in-person evaluation if the symptoms worsen or if the condition fails to improve as anticipated.    Total Time spent in visit with the patient was:  19 minutes, of which more than 50% of the time was spent in counseling and/or coordinating care on above.   Pt understands and agrees with the plan of care outlined.     Cira Servant, DO

## 2019-03-20 ENCOUNTER — Encounter: Payer: Self-pay | Admitting: Neurology

## 2019-03-20 ENCOUNTER — Telehealth (INDEPENDENT_AMBULATORY_CARE_PROVIDER_SITE_OTHER): Payer: BC Managed Care – PPO | Admitting: Neurology

## 2019-03-20 ENCOUNTER — Other Ambulatory Visit: Payer: Self-pay

## 2019-03-20 VITALS — Wt 236.0 lb

## 2019-03-20 DIAGNOSIS — G8929 Other chronic pain: Secondary | ICD-10-CM | POA: Diagnosis not present

## 2019-03-20 DIAGNOSIS — M542 Cervicalgia: Secondary | ICD-10-CM

## 2019-03-20 DIAGNOSIS — G43709 Chronic migraine without aura, not intractable, without status migrainosus: Secondary | ICD-10-CM | POA: Diagnosis not present

## 2019-03-20 DIAGNOSIS — M545 Low back pain: Secondary | ICD-10-CM

## 2019-03-20 MED ORDER — NORTRIPTYLINE HCL 25 MG PO CAPS: ORAL_CAPSULE | ORAL | 0 refills | Status: DC

## 2019-03-20 MED ORDER — RIZATRIPTAN BENZOATE 10 MG PO TBDP: ORAL_TABLET | ORAL | 3 refills | Status: DC

## 2019-03-20 MED ORDER — NORTRIPTYLINE HCL 25 MG PO CAPS: 25.0000 mg | ORAL_CAPSULE | Freq: Every day | ORAL | 3 refills | Status: DC

## 2019-03-20 MED ORDER — TIZANIDINE HCL 2 MG PO TABS: 2.0000 mg | ORAL_TABLET | Freq: Three times a day (TID) | ORAL | 3 refills | Status: DC | PRN

## 2019-03-20 NOTE — Patient Instructions (Signed)
1.  Start nortriptyline 25mg  capsule.  Take 1 capsule at bedtime for 7 days, then increase to 2 capsules at bedtime.  At end of prescription, contact me for refill and whether we need to increase dose. 2.  At earliest onset of migraine, take rizatriptan 10mg .  May repeat dose once after 2 hours if needed (maximum 2 tablets in 24 hours) 3.  Limit use of pain relievers to no more than 2 days out of week to prevent risk of rebound or medication-overuse headache. 4.  Take tizanidine 2mg  for neck and back pain.  May take up to 3 times daily as needed.  Caution for drowsiness. 5.  Keep headache diary 6.  Stop all caffeine.  Continue drinking plenty of water. 7.  Follow up in 4 months.

## 2019-04-26 ENCOUNTER — Other Ambulatory Visit: Payer: Self-pay

## 2019-04-26 ENCOUNTER — Ambulatory Visit (HOSPITAL_COMMUNITY)
Admission: EM | Admit: 2019-04-26 | Discharge: 2019-04-26 | Disposition: A | Discharge status: Home / Self Care | Payer: BC Managed Care – PPO | Attending: Family Medicine | Admitting: Family Medicine

## 2019-04-26 ENCOUNTER — Encounter (HOSPITAL_COMMUNITY): Payer: Self-pay

## 2019-04-26 DIAGNOSIS — J029 Acute pharyngitis, unspecified: Secondary | ICD-10-CM | POA: Diagnosis present

## 2019-04-26 DIAGNOSIS — N390 Urinary tract infection, site not specified: Secondary | ICD-10-CM | POA: Diagnosis present

## 2019-04-26 LAB — POCT URINALYSIS DIP (DEVICE)
Bilirubin Urine: NEGATIVE
Glucose, UA: NEGATIVE mg/dL
Ketones, ur: NEGATIVE mg/dL
Nitrite: NEGATIVE
Protein, ur: NEGATIVE mg/dL
Specific Gravity, Urine: 1.025 (ref 1.005–1.030)
Urobilinogen, UA: 0.2 mg/dL (ref 0.0–1.0)
pH: 7 (ref 5.0–8.0)

## 2019-04-26 LAB — POCT RAPID STREP A: Streptococcus, Group A Screen (Direct): NEGATIVE

## 2019-04-26 LAB — POCT PREGNANCY, URINE: Preg Test, Ur: NEGATIVE

## 2019-04-26 MED ORDER — CEPHALEXIN 500 MG PO CAPS: 500.0000 mg | ORAL_CAPSULE | Freq: Two times a day (BID) | ORAL | 0 refills | Status: AC

## 2019-04-26 NOTE — ED Notes (Signed)
Patient able to ambulate independently  

## 2019-04-26 NOTE — ED Provider Notes (Signed)
[facility]    CSN: [id number] Arrival date & time: 04/26/19  [id number]     History   Chief Complaint Chief Complaint  Patient presents with  . Urinary Tract Infection  . Rash  . Nausea    HPI Elizabeth Frank is a 28 y.o. female.   Elizabeth Frank presents with complaints of urinary frequency, urgency and pain with urination. Started 4/30. Has been taking AZO  Which has helped some. Yesterday had some back and abdominal pain, no current. No fevers. No blood in urine. She is on birth control. Has had increased than normal white vaginal discharge, no odor pain or itching. Throat has been red and itching. She tells nursing after my exam that she is worried about chlamydia. No fevers. No cough or URI symptoms. No ear pain.  No skin rash.     ROS per HPI, negative if not otherwise mentioned.      History reviewed. No pertinent past medical history.  There are no active problems to display for this patient.   History reviewed. No pertinent surgical history.  OB History   No obstetric history on file.      Home Medications    Prior to Admission medications   Medication Sig Start Date End Date Taking? Authorizing Provider  cephALEXin (KEFLEX) 500 MG capsule Take 1 capsule (500 mg total) by mouth 2 (two) times daily for 7 days. 04/26/19 05/03/19  Georgetta HaberBurky, Natalie B, NP  ibuprofen (ADVIL,MOTRIN) 200 MG tablet Take 200 mg by mouth every 6 (six) hours as needed.    [provider]  nortriptyline (PAMELOR) 25 MG capsule Take 1 capsule at bedtime for 7 days, then increase to 2 capsules at bedtime. 03/20/19   Drema DallasJaffe, Adam R, DO  rizatriptan (MAXALT-MLT) 10 MG disintegrating tablet Take 1 tablet earliest onset of migraine.  May repeat x1 in 2 hours if needed.  Max 2 tabs in 24 hours 03/20/19   Drema DallasJaffe, Adam R, DO    Family History History reviewed. No pertinent family history.  Social History Social History   Tobacco Use  . Smoking status: Never Smoker  . Smokeless  tobacco: Never Used  Substance Use Topics  . Alcohol use: No  . Drug use: No     Allergies   Patient has no known allergies.   Review of Systems Review of Systems   Physical Exam Triage Vital Signs ED Triage Vitals  Enc Vitals Group     BP 04/26/19 0907 (!) 137/95     Pulse Rate 04/26/19 0907 95     Resp --      Temp 04/26/19 0907 98 F (36.7 C)     Temp src --      SpO2 04/26/19 0907 97 %     Weight --      Height --      Head Circumference --      Peak Flow --      Pain Score 04/26/19 0908 3     Pain Loc --      Pain Edu? --      Excl. in GC? --    No data found.  Updated Vital Signs BP (!) 137/95   Pulse 95   Temp 98 F (36.7 C)   LMP 04/03/2019 (Approximate)   SpO2 97%    Physical Exam Constitutional:      General: She is not in acute distress.    Appearance: She is well-developed.  HENT:  Mouth/Throat:     Tonsils: No tonsillar exudate or tonsillar abscesses. 1+ on the right. 1+ on the left.     Comments: Palatal petechia noted  Cardiovascular:     Rate and Rhythm: Normal rate and regular rhythm.     Heart sounds: Normal heart sounds.  Pulmonary:     Effort: Pulmonary effort is normal.     Breath sounds: Normal breath sounds.  Abdominal:     Tenderness: There is no abdominal tenderness. There is no right CVA tenderness or left CVA tenderness.  Skin:    General: Skin is warm and dry.  Neurological:     Mental Status: She is alert and oriented to person, place, and time.      UC Treatments / Results  Labs (all labs ordered are listed, but only abnormal results are displayed) Labs Reviewed  POCT URINALYSIS DIP (DEVICE) - Abnormal; Notable for the following components:      Result Value   Hgb urine dipstick TRACE (*)    Leukocytes,Ua TRACE (*)    All other components within normal limits  URINE CULTURE  CULTURE, GROUP A STREP ([facility])  POC URINE PREG, ED  POCT PREGNANCY, URINE  POCT RAPID STREP A  CERVICOVAGINAL ANCILLARY ONLY     EKG None  Radiology No results found.  Procedures Procedures (including critical care time)  Medications Ordered in UC Medications - No data to display  Initial Impression / Assessment and Plan / UC Course  I have reviewed the triage vital signs and the nursing notes.  Pertinent labs & imaging results that were available during my care of the patient were reviewed by me and considered in my medical decision making (see chart for details).     Urine with hgb and leuks noted with symptoms consistent with uti, keflex provided with culture pending for confirmation. Vaginal cytology collected as well as oral cytology. Negative rapid strep. Return precautions provided. If symptoms worsen or do not improve in the next week to return to be seen or to follow up with PCP.  Patient verbalized understanding and agreeable to plan.   Final Clinical Impressions(s) / UC Diagnoses   Final diagnoses:  Lower urinary tract infectious disease  Acute pharyngitis, unspecified etiology     Discharge Instructions     I have started antibiotics for UTI. Complete course of antibiotics.  Negative for strep throat today.  Drink plenty of water to empty bladder regularly. Avoid alcohol and caffeine as these may irritate the bladder.   Decongestants, nasal sprays, and/or anthistamines may help with post nasal drip which may help your sore throat.  Throat lozenges, gargles, chloraseptic spray, warm teas, popsicles etc to help with throat pain.   Will notify you of any positive findings from your vaginal swab and if any changes to treatment are needed.  You may monitor your results on your MyChart online as well.   If symptoms worsen or do not improve in the next week to return to be seen or to follow up with your PCP.     ED Prescriptions    Medication Sig Dispense Auth. Provider   cephALEXin (KEFLEX) 500 MG capsule Take 1 capsule (500 mg total) by mouth 2 (two) times daily for 7 days. 14 capsule Georgetta Haber, NP     Controlled Substance Prescriptions [place] Controlled Substance Registry consulted? Not Applicable   Georgetta Haber, NP 04/26/19 1027

## 2019-04-26 NOTE — Discharge Instructions (Addendum)
I have started antibiotics for UTI. Complete course of antibiotics.  Negative for strep throat today.  Drink plenty of water to empty bladder regularly. Avoid alcohol and caffeine as these may irritate the bladder.   Decongestants, nasal sprays, and/or anthistamines may help with post nasal drip which may help your sore throat.  Throat lozenges, gargles, chloraseptic spray, warm teas, popsicles etc to help with throat pain.   Will notify you of any positive findings from your vaginal swab and if any changes to treatment are needed.  You may monitor your results on your MyChart online as well.   If symptoms worsen or do not improve in the next week to return to be seen or to follow up with your PCP.

## 2019-04-26 NOTE — ED Triage Notes (Signed)
Pt states she began having uti symptoms last week then rash developed in throat this week and also stated that she vomited once

## 2019-04-27 LAB — URINE CULTURE: Culture: <10000 — AB

## 2019-04-27 LAB — CERVICOVAGINAL ANCILLARY ONLY
Bacterial vaginitis: POSITIVE — AB
Candida vaginitis: POSITIVE — AB
Chlamydia: NEGATIVE
Neisseria Gonorrhea: NEGATIVE
Trichomonas: NEGATIVE

## 2019-04-27 LAB — CYTOLOGY, (ORAL, ANAL, URETHRAL) ANCILLARY ONLY
Chlamydia: NEGATIVE
Neisseria Gonorrhea: NEGATIVE

## 2019-04-28 ENCOUNTER — Telehealth (HOSPITAL_COMMUNITY): Payer: Self-pay | Admitting: Emergency Medicine

## 2019-04-28 MED ORDER — METRONIDAZOLE 500 MG PO TABS: 500.0000 mg | ORAL_TABLET | Freq: Two times a day (BID) | ORAL | 0 refills | Status: DC

## 2019-04-28 MED ORDER — FLUCONAZOLE 150 MG PO TABS: 150.0000 mg | ORAL_TABLET | Freq: Once | ORAL | 0 refills | Status: AC

## 2019-04-28 NOTE — Telephone Encounter (Signed)
Bacterial vaginosis is positive. This was not treated at the urgent care visit.  Flagyl 500 mg BID x 7 days #14 no refills sent to patients pharmacy of choice.    Test for candida (yeast) was positive.  Prescription for fluconazole 150mg po now, repeat dose in 3d if needed, #2 no refills, sent to the pharmacy of record.  Recheck or followup with PCP for further evaluation if symptoms are not improving.    Patient contacted and made aware of all results, all questions answered.   

## 2019-04-29 ENCOUNTER — Telehealth (HOSPITAL_COMMUNITY): Payer: Self-pay | Admitting: Emergency Medicine

## 2019-04-29 LAB — CULTURE, GROUP A STREP (THRC)

## 2019-04-29 MED ORDER — METRONIDAZOLE 500 MG PO TABS: 500.0000 mg | ORAL_TABLET | Freq: Two times a day (BID) | ORAL | 0 refills | Status: AC

## 2019-04-29 MED ORDER — FLUCONAZOLE 150 MG PO TABS: 150.0000 mg | ORAL_TABLET | Freq: Every day | ORAL | 0 refills | Status: DC

## 2019-05-10 ENCOUNTER — Ambulatory Visit: Payer: BC Managed Care – PPO | Admitting: Neurology

## 2019-05-10 ENCOUNTER — Encounter

## 2019-07-19 NOTE — Progress Notes (Signed)
Virtual Visit via Video Note The purpose of this virtual visit is to provide medical care while limiting exposure to the novel coronavirus.    Consent was obtained for video visit:  Yes.   Answered questions that patient had about telehealth interaction:  Yes.   I discussed the limitations, risks, security and privacy concerns of performing an evaluation and management service by telemedicine. I also discussed with the patient that there may be a patient responsible charge related to this service. The patient expressed understanding and agreed to proceed.  Pt location: Home Physician Location: Home Name of referring provider:  Pllc, [place] connected with Elizabeth M Wenke at patients initiation/request on 07/20/2019 at  9:50 AM EDT by video enabled telemedicine application and verified that I am speaking with the correct person using two identifiers. Pt MRN:  [id number] Pt DOB:  06/20/1991 Video Participants:  Elizabeth Frank   History of Present Illness:  Elizabeth Cutrone is a 28 year old right-handed female who follows up for episodic tension type headache.  UPDATE: Restarted on nortriptyline in March.  Migraines improved.  She also made lifestyle changes that have decreased stress, which has helped.  Intensity:  moderate Duration:  30 minutes to 1 hour Frequency:  4 to 5 headaches a month (1 are migraine)  Current NSAIDs:  ibuprofen Current analgesic:  Excedrin Current triptan:  Rizatriptan 10mg  (causes jaw pain) Current preventative medication: nortriptyline 50mg  at bedtime Birth control:  Tri-Estarylla  She sleeps well. Shestillwalks and stretchesbut needs to improve. She tries to keep hydrated.  HISTORY: She has longstanding history of neck and back pain.When she gets the neck pain, she develops a headache radiating from the back of her head up to the front, bilaterally.It is usually an ache but sometimes throbbing.When severe, there may be  photophobia or phonophobia but no nausea or visual disturbance.It is about 6-7/10 intensity.Initially, it lasts a couple of hours and occurs 4 to 5 times a week.No specific triggers.   She was started on nortriptyline in 2018 and headaches had been well-controlled.  She self-discontinued nortriptyline in summer 2019 since the headaches had resolved.  In September 2019, she had increased emotional stress after she broke up with her boyfriend.  Since then, she reports increased frequency of headaches, including migraines.  Migraines are 8-9/10 intensity and associated with blurred vision, photophobia, phonophobia and sometimes nausea (no vomiting or unilateral numbness or weakness).  They typically last all day.  Her typical headaches are 6-7/10, last 2 hours if treated but may last up to all day.  She started having near daily headaches, about 4 a month were what she calls "migraines".  She has been treating headaches with ibuprofen or Goody powder about 3 to 4 days a week.  She has had increased neck and back pain as well.  Past medication: Ibuprofen 800mg , cyclobenzaprine  X-rays of the cervical and lumbar spines from 02/21/16 were normal..  Past Medical History: No past medical history on file.  Medications: Outpatient Encounter Medications as of 07/20/2019  Medication Sig  . fluconazole (DIFLUCAN) 150 MG tablet Take 1 tablet (150 mg total) by mouth daily.  Marland Kitchen ibuprofen (ADVIL,MOTRIN) 200 MG tablet Take 200 mg by mouth every 6 (six) hours as needed.  . nortriptyline (PAMELOR) 25 MG capsule Take 1 capsule at bedtime for 7 days, then increase to 2 capsules at bedtime.  . rizatriptan (MAXALT-MLT) 10 MG disintegrating tablet Take 1 tablet earliest onset of migraine.  May repeat x1  in 2 hours if needed.  Max 2 tabs in 24 hours   No facility-administered encounter medications on file as of 07/20/2019.     Allergies: No Known Allergies  Family History: No family history on file.  Social  History: Social History   Socioeconomic History  . Marital status: Single    Spouse name: Not on file  . Number of children: Not on file  . Years of education: Not on file  . Highest education level: Not on file  Occupational History  . Not on file  Social Needs  . Financial resource strain: Not on file  . Food insecurity    Worry: Not on file    Inability: Not on file  . Transportation needs    Medical: Not on file    Non-medical: Not on file  Tobacco Use  . Smoking status: Never Smoker  . Smokeless tobacco: Never Used  Substance and Sexual Activity  . Alcohol use: No  . Drug use: No  . Sexual activity: Not on file  Lifestyle  . Physical activity    Days per week: Not on file    Minutes per session: Not on file  . Stress: Not on file  Relationships  . Social Musicianconnections    Talks on phone: Not on file    Gets together: Not on file    Attends religious service: Not on file    Active member of club or organization: Not on file    Attends meetings of clubs or organizations: Not on file    Relationship status: Not on file  . Intimate partner violence    Fear of current or ex partner: Not on file    Emotionally abused: Not on file    Physically abused: Not on file    Forced sexual activity: Not on file  Other Topics Concern  . Not on file  Social History Narrative  . Not on file    Observations/Objective:   Temperature 98.7 F (37.1 C), height 5\' 6"  (1.676 m), weight 230 lb (104.3 kg). No acute distress.  Alert and oriented.  Speech fluent and not dysarthric.  Language intact.  Eyes orthophoric on primary gaze.  Face symmetric.  Assessment and Plan:   Migraine without aura, without status migrainosus, not intractable  1.  For preventative management, we will attempt to further reduce headache frequency by increasing nortriptyline to 75mg  at bedtime 2.  For abortive therapy, ibuprofen or Excedrin Migraine at earliest onset.  Discontinue rizatriptan due to side  effects. 3.  Limit use of pain relievers to no more than 2 days out of week to prevent risk of rebound or medication-overuse headache. 4.  Keep headache diary 5.  Exercise, hydration, caffeine cessation, sleep hygiene, monitor for and avoid triggers 6.  Consider:  magnesium citrate 400mg  daily, riboflavin 400mg  daily, and coenzyme Q10 100mg  three times daily 7. Always keep in mind that currently taking a hormone or birth control may be a possible trigger or aggravating factor for migraine. 8. Follow up 4 months   Follow Up Instructions:    -I discussed the assessment and treatment plan with the patient. The patient was provided an opportunity to ask questions and all were answered. The patient agreed with the plan and demonstrated an understanding of the instructions.   The patient was advised to call back or seek an in-person evaluation if the symptoms worsen or if the condition fails to improve as anticipated.    Cira ServantAdam Robert Jaffe, DO

## 2019-07-20 ENCOUNTER — Encounter: Payer: Self-pay | Admitting: Neurology

## 2019-07-20 ENCOUNTER — Other Ambulatory Visit: Payer: Self-pay

## 2019-07-20 ENCOUNTER — Telehealth (INDEPENDENT_AMBULATORY_CARE_PROVIDER_SITE_OTHER): Payer: BC Managed Care – PPO | Admitting: Neurology

## 2019-07-20 VITALS — Temp 98.7°F | Ht 66.0 in | Wt 230.0 lb

## 2019-07-20 DIAGNOSIS — G43009 Migraine without aura, not intractable, without status migrainosus: Secondary | ICD-10-CM

## 2019-07-20 MED ORDER — NORTRIPTYLINE HCL 25 MG PO CAPS: 75.0000 mg | ORAL_CAPSULE | Freq: Every day | ORAL | 3 refills | Status: DC

## 2019-11-24 ENCOUNTER — Telehealth: Payer: BC Managed Care – PPO | Admitting: Neurology

## 2019-12-11 ENCOUNTER — Encounter: Payer: Self-pay | Admitting: Neurology

## 2019-12-11 NOTE — Progress Notes (Signed)
Virtual Visit via Video Note The purpose of this virtual visit is to provide medical care while limiting exposure to the novel coronavirus.    Consent was obtained for video visit:  Yes.   Answered questions that patient had about telehealth interaction:  Yes.   I discussed the limitations, risks, security and privacy concerns of performing an evaluation and management service by telemedicine. I also discussed with the patient that there may be a patient responsible charge related to this service. The patient expressed understanding and agreed to proceed.  Pt location: Home Physician Location: office Name of referring provider:  No ref. provider found I connected with Elizabeth Frank at patients initiation/request on 12/12/2019 at  1:30 PM EST by video enabled telemedicine application and verified that I am speaking with the correct person using two identifiers. Pt MRN:  [id number] Pt DOB:  1991/06/14 Video Participants:  Elizabeth Frank   History of Present Illness:  Elizabeth Boeke is a 28 year old right-handed female who follows up for episodic tension type headache.  UPDATE: Intensity:  moderate Duration:  30 to 60 minutes Frequency:  4 or 5 headaches a month (1 are migraine)  Current NSAIDs:  Ibuprofen Current analgesic:  Excedrin Current antidepressant:  Nortriptyline 75mg  Birth control:  Tri-Estarylla  She sleeps well. Shestillwalks and stretchesbut needs to improve. She tries to keep hydrated.  Her back pain has flared up more recently.  It his particularly bad when bending forward.  She has tried stretching.  Muscle relaxants and ibuprofen ineffective.    HISTORY: She has longstanding history of neck and back pain.When she gets the neck pain, she develops a headache radiating from the back of her head up to the front, bilaterally.It is usually an ache but sometimes throbbing.When severe, there may be photophobia or phonophobia but no nausea or visual  disturbance.It is about 6-7/10 intensity.Initially, it lasts a couple of hours and occurs 4 to 5 times a week. Headaches had subsequently been well-controlled on nortriptyline.  She self-discontinued nortriptyline in 2019 since the headaches had resolved.  In September 2019, she had increased emotional stress after she broke up with her boyfriend.  Since then, she reports increased frequency of headaches, including migraines.  Migraines became 8-9/10 intensity and associated with blurred vision, photophobia, phonophobia and sometimes nausea (no vomiting or unilateral numbness or weakness).  They would typically last all day.  Her typical headaches are 6-7/10, last 2 hours if treated but may last up to all day.  She has had 25 headache days in past 30 days, about 4 were what she calls "migraines".  She has been treating headaches with ibuprofen or Goody powder about 3 to 4 days a week.  She has had increased neck and back pain as well.   Past analgesics:  Goody powder Past triptan:  Rizatriptan (side effects) Past muscle relaxant: cyclobenzaprine  X-rays of the cervical and lumbar spines from 02/21/16 were normal..  Past Medical History: No past medical history on file.  Medications: Outpatient Encounter Medications as of 12/12/2019  Medication Sig  . ibuprofen (ADVIL,MOTRIN) 200 MG tablet Take 200 mg by mouth every 6 (six) hours as needed.  . nortriptyline (PAMELOR) 25 MG capsule Take 3 capsules (75 mg total) by mouth at bedtime.  Marland Kitchen tiZANidine (ZANAFLEX) 2 MG tablet Take by mouth once as needed for muscle spasms. For neck pain  . TRI-ESTARYLLA 0.18/0.215/0.25 MG-35 MCG tablet Take 1 tablet by mouth daily.   No facility-administered encounter medications on  file as of 12/12/2019.    Allergies: No Known Allergies  Family History: Family History  Problem Relation Age of Onset  . Hypertension Father   . Obesity Sister     Social History: Social History   Socioeconomic History  .  Marital status: Single    Spouse name: Not on file  . Number of children: 0  . Years of education: 16  . Highest education level: Bachelor's degree (e.g., BA, AB, BS)  Occupational History  . Occupation: Magazine features editor: [employer]  Tobacco Use  . Smoking status: Never Smoker  . Smokeless tobacco: Never Used  Substance and Sexual Activity  . Alcohol use: No  . Drug use: No  . Sexual activity: Not on file  Other Topics Concern  . Not on file  Social History Narrative   Patient is left-handed. She lives with friends in a one level home. She occasionally drinks caffeine. She does not exercise.    Social Determinants of Health   Financial Resource Strain:   . Difficulty of Paying Living Expenses: Not on file  Food Insecurity:   . Worried About Programme researcher, broadcasting/film/video in the Last Year: Not on file  . Ran Out of Food in the Last Year: Not on file  Transportation Needs:   . Lack of Transportation (Medical): Not on file  . Lack of Transportation (Non-Medical): Not on file  Physical Activity:   . Days of Exercise per Week: Not on file  . Minutes of Exercise per Session: Not on file  Stress:   . Feeling of Stress : Not on file  Social Connections:   . Frequency of Communication with Friends and Family: Not on file  . Frequency of Social Gatherings with Friends and Family: Not on file  . Attends Religious Services: Not on file  . Active Member of Clubs or Organizations: Not on file  . Attends Banker Meetings: Not on file  . Marital Status: Not on file  Intimate Partner Violence:   . Fear of Current or Ex-Partner: Not on file  . Emotionally Abused: Not on file  . Physically Abused: Not on file  . Sexually Abused: Not on file    Observations/Objective:   Height 5\' 6"  (1.676 m), weight 236 lb (107 kg). No acute distress.  Alert and oriented.  Speech fluent and not dysarthric.  Language intact.  Eyes orthophoric on primary gaze.  Face symmetric.   Assessment and Plan:   Migraine without aura, without status migrainosus, not intractable  1.  For preventative management, nortriptyline 75mg  at bedtime 2.  For abortive therapy, ibuprofen or Excedrin Migraine 3.  Limit use of pain relievers to no more than 2 days out of week to prevent risk of rebound or medication-overuse headache. 4.  Keep headache diary 5. For back pain, will refer to Sports Medicine. 6.  Exercise, hydration, caffeine cessation, sleep hygiene, monitor for and avoid triggers 7.  Consider:  magnesium citrate 400mg  daily, riboflavin 400mg  daily, and coenzyme Q10 100mg  three times daily 8. Follow up 6 months   Follow Up Instructions:    -I discussed the assessment and treatment plan with the patient. The patient was provided an opportunity to ask questions and all were answered. The patient agreed with the plan and demonstrated an understanding of the instructions.   The patient was advised to call back or seek an in-person evaluation if the symptoms worsen or if the condition fails to improve as anticipated.  Dudley Major, DO

## 2019-12-12 ENCOUNTER — Encounter: Payer: Self-pay | Admitting: Neurology

## 2019-12-12 ENCOUNTER — Other Ambulatory Visit: Payer: Self-pay

## 2019-12-12 ENCOUNTER — Telehealth (INDEPENDENT_AMBULATORY_CARE_PROVIDER_SITE_OTHER): Payer: BC Managed Care – PPO | Admitting: Neurology

## 2019-12-12 VITALS — Ht 66.0 in | Wt 236.0 lb

## 2019-12-12 DIAGNOSIS — M545 Low back pain, unspecified: Secondary | ICD-10-CM

## 2019-12-12 DIAGNOSIS — G44219 Episodic tension-type headache, not intractable: Secondary | ICD-10-CM

## 2019-12-12 DIAGNOSIS — G43009 Migraine without aura, not intractable, without status migrainosus: Secondary | ICD-10-CM

## 2019-12-19 ENCOUNTER — Emergency Department (HOSPITAL_COMMUNITY)
Admission: EM | Admit: 2019-12-19 | Discharge: 2019-12-19 | Disposition: A | Discharge status: Home / Self Care | Payer: BC Managed Care – PPO | Attending: Emergency Medicine | Admitting: Emergency Medicine

## 2019-12-19 ENCOUNTER — Other Ambulatory Visit: Payer: Self-pay | Admitting: Neurology

## 2019-12-19 ENCOUNTER — Other Ambulatory Visit: Payer: Self-pay

## 2019-12-19 ENCOUNTER — Emergency Department (HOSPITAL_COMMUNITY): Payer: BC Managed Care – PPO

## 2019-12-19 ENCOUNTER — Encounter (HOSPITAL_COMMUNITY): Payer: Self-pay

## 2019-12-19 DIAGNOSIS — Y9301 Activity, walking, marching and hiking: Secondary | ICD-10-CM | POA: Insufficient documentation

## 2019-12-19 DIAGNOSIS — Y999 Unspecified external cause status: Secondary | ICD-10-CM | POA: Diagnosis not present

## 2019-12-19 DIAGNOSIS — M545 Low back pain, unspecified: Secondary | ICD-10-CM

## 2019-12-19 DIAGNOSIS — Z79899 Other long term (current) drug therapy: Secondary | ICD-10-CM | POA: Insufficient documentation

## 2019-12-19 DIAGNOSIS — Y929 Unspecified place or not applicable: Secondary | ICD-10-CM | POA: Insufficient documentation

## 2019-12-19 DIAGNOSIS — W000XXA Fall on same level due to ice and snow, initial encounter: Secondary | ICD-10-CM | POA: Diagnosis not present

## 2019-12-19 DIAGNOSIS — W19XXXA Unspecified fall, initial encounter: Secondary | ICD-10-CM

## 2019-12-19 DIAGNOSIS — Z3202 Encounter for pregnancy test, result negative: Secondary | ICD-10-CM | POA: Insufficient documentation

## 2019-12-19 LAB — POC URINE PREG, ED: Preg Test, Ur: NEGATIVE

## 2019-12-19 IMAGING — DX DG LUMBAR SPINE COMPLETE 4+V
5 series · 5 of 5 positions shown · non-contrast
Comparison: [DATE]

CLINICAL DATA: Back pain after fall

EXAM:
LUMBAR SPINE - COMPLETE 4+ VIEW

[l-spine ap]
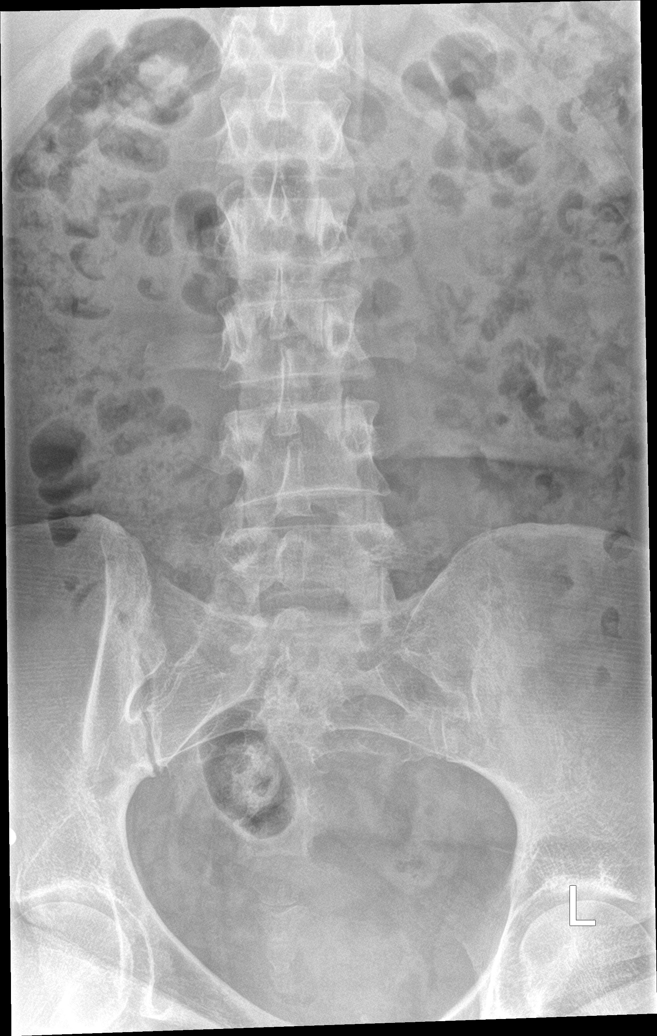

[l-spine obl (1 of 2)]
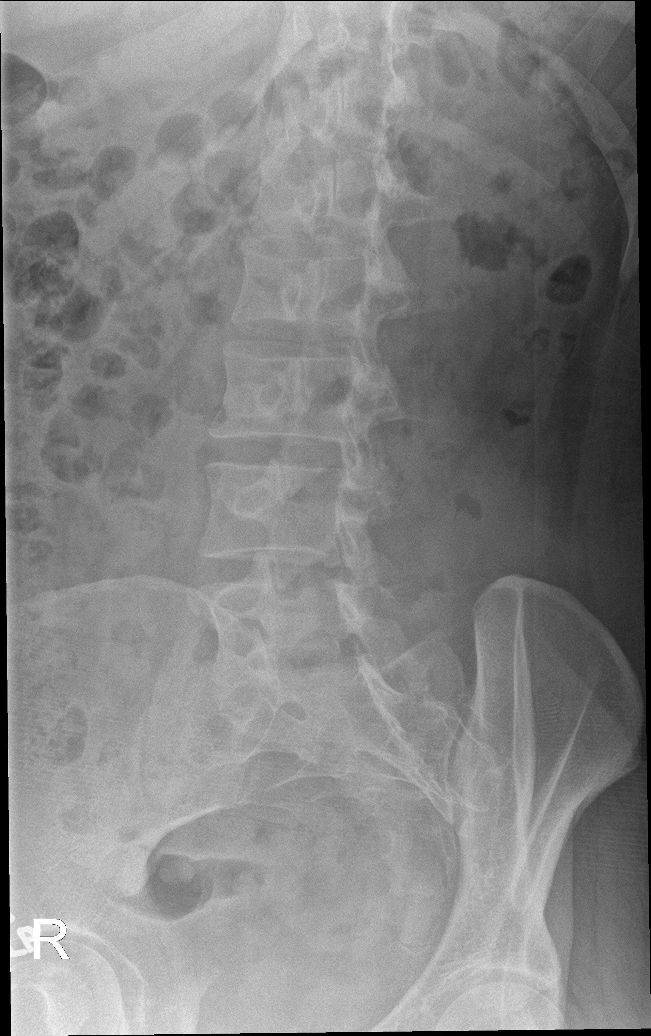

[l-spine obl (2 of 2)]
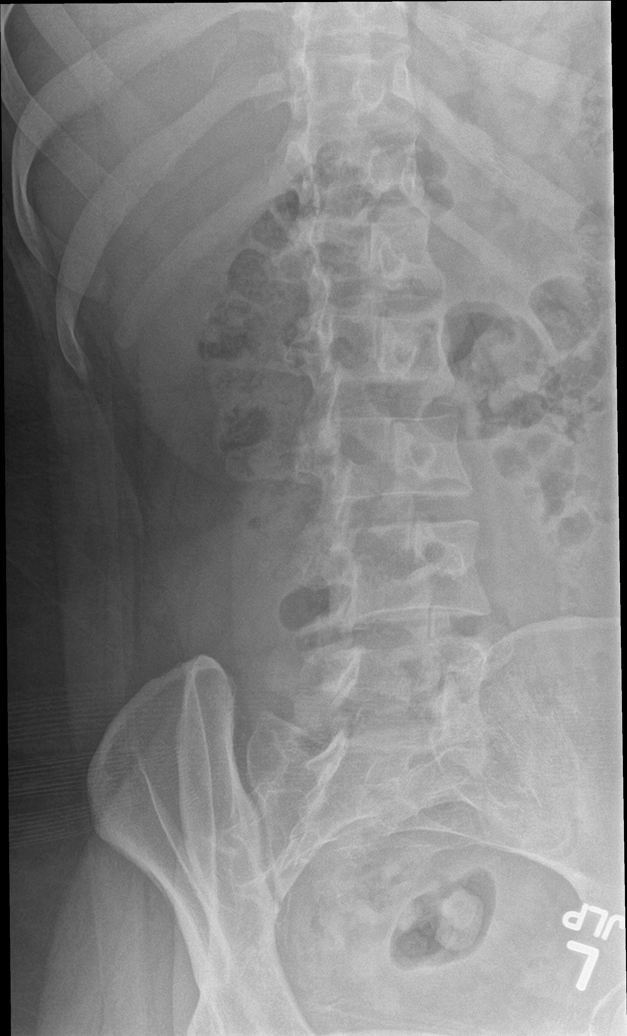

[l-spine lat]
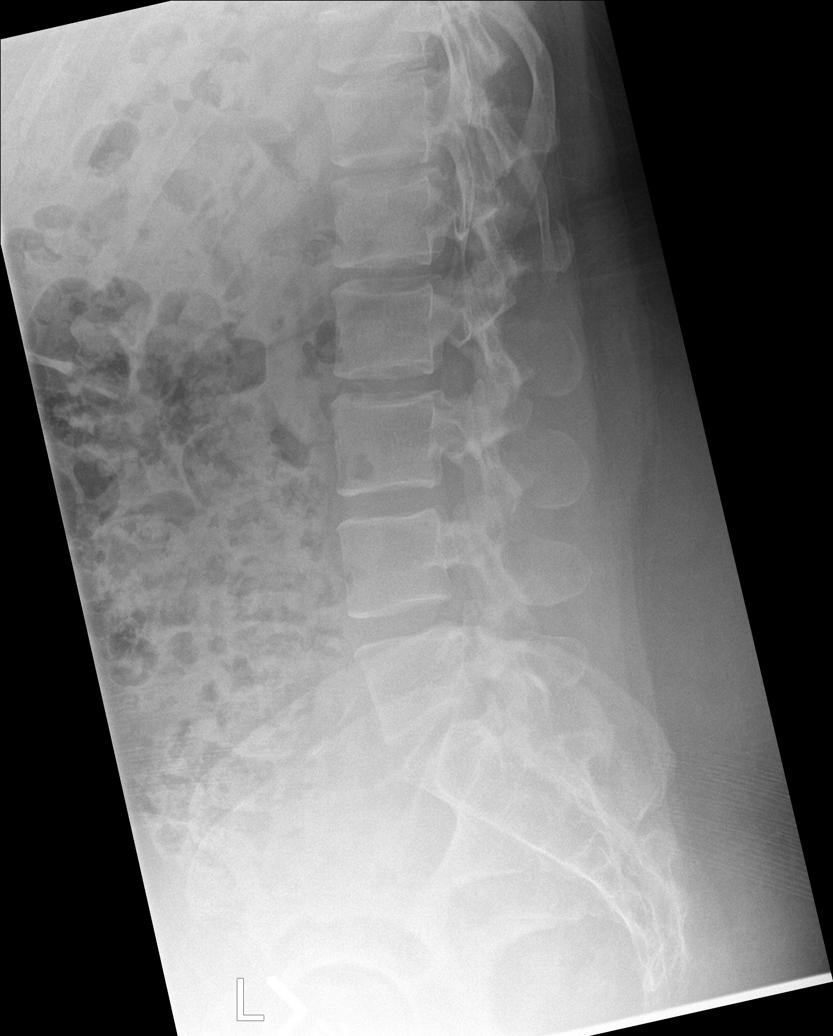

[l-spine spot]
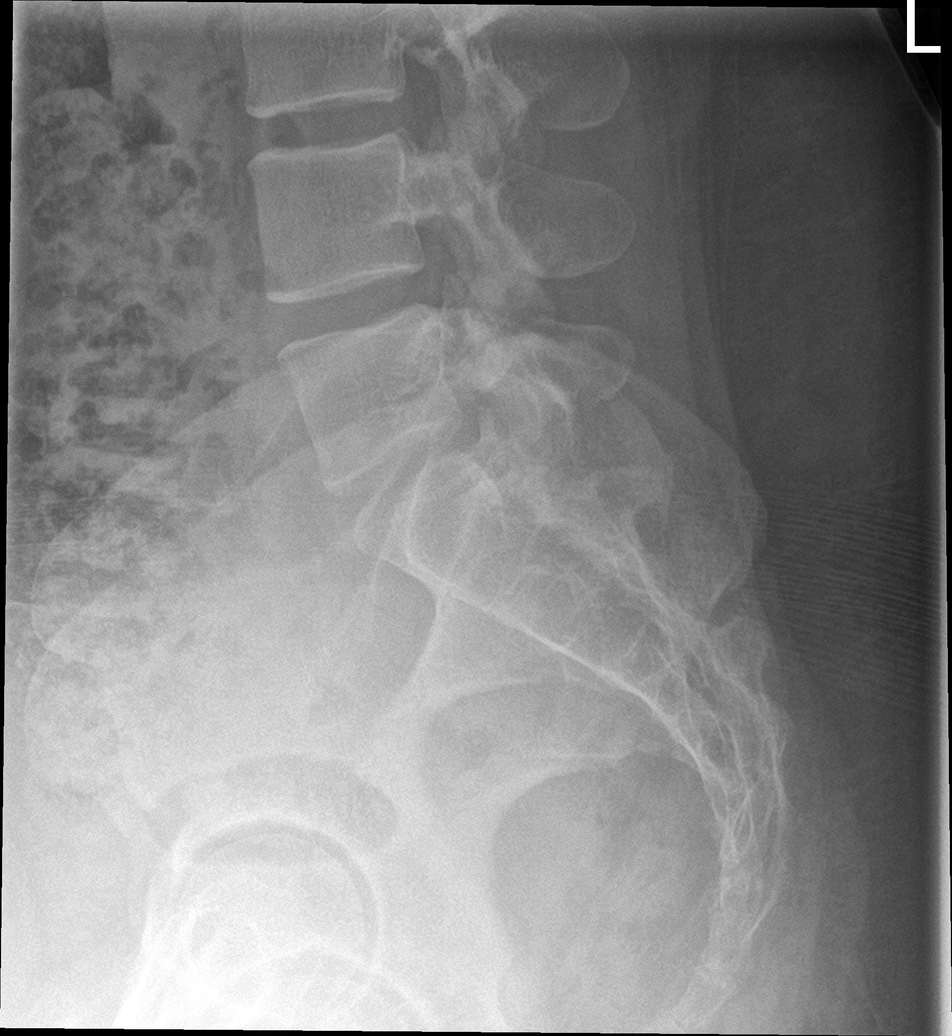

[5 of 5 positions shown; findings below may reference images not displayed]

FINDINGS: There is no evidence of lumbar spine fracture. Alignment is normal.
Intervertebral disc spaces are maintained.
IMPRESSION: Negative.

## 2019-12-19 MED ORDER — METHOCARBAMOL 500 MG PO TABS: 500.0000 mg | ORAL_TABLET | Freq: Three times a day (TID) | ORAL | 0 refills | Status: DC

## 2019-12-19 MED ORDER — HYDROCODONE-ACETAMINOPHEN 5-325 MG PO TABS
1.0000 | ORAL_TABLET | Freq: Once | ORAL | Status: AC
  Administered 2019-12-19: 1 via ORAL
  Filled 2019-12-19: qty 1

## 2019-12-19 MED ORDER — HYDROCODONE-ACETAMINOPHEN 7.5-325 MG PO TABS: 1.0000 | ORAL_TABLET | Freq: Four times a day (QID) | ORAL | 0 refills | Status: DC | PRN

## 2019-12-19 MED ORDER — HYDROCODONE-ACETAMINOPHEN 5-325 MG PO TABS
1.0000 | ORAL_TABLET | Freq: Once | ORAL | Status: AC
  Administered 2019-12-19: 11:00:00 1 via ORAL
  Filled 2019-12-19: qty 1

## 2019-12-19 NOTE — ED Triage Notes (Signed)
Pt presents to ED following fall this morning. Pt states she slipped on some ice and hit her bottom. Pt c/o sacral pain and mid back pain. Pt denies hitting head or LOC.

## 2019-12-19 NOTE — Discharge Instructions (Addendum)
Apply ice packs on and off to your back.  Follow-up with your primary doctor for recheck.  Return to the ER if you develop any worsening symptoms.

## 2019-12-19 NOTE — ED Provider Notes (Signed)
[facility] EMERGENCY DEPARTMENT Provider Note   CSN: [id number] Arrival date & time: 12/19/19  2751     History Chief Complaint  Patient presents with  . Fall    Elizabeth Frank is a 28 y.o. female.  HPI      Elizabeth Frank is a 28 y.o. female with past medical history of low back pain.  she presents to the Emergency Department complaining of worsening low back pain and tailbone pain secondary to a mechanical fall that occurred early this morning.  She states that she was walking to her car when she slipped on some ice and fell on her buttocks.  She reports sharp stabbing pain to her mid to lower back and throbbing pain along her tailbone area.  Pain is worsened with movement and improves while lying on her left side in the fetal position.  She denies head injury, neck pain, LOC, numbness or weakness of her extremities.  No abdominal pain, urine or bowel changes, nausea or vomiting.  She states she is scheduled to see a sports medicine provider for her back pain, but has not been evaluated yet.   Past Medical History:  Diagnosis Date  . Frequent headaches     There are no problems to display for this patient.   Past Surgical History:  Procedure Laterality Date  . two teeth removed       OB History   No obstetric history on file.     Family History  Problem Relation Age of Onset  . Hypertension Father   . Obesity Sister     Social History   Tobacco Use  . Smoking status: Never Smoker  . Smokeless tobacco: Never Used  Substance Use Topics  . Alcohol use: No  . Drug use: Yes    Types: Marijuana    Home Medications Prior to Admission medications   Medication Sig Start Date End Date Taking? Authorizing Provider  ibuprofen (ADVIL,MOTRIN) 200 MG tablet Take 200 mg by mouth every 6 (six) hours as needed.    [provider]  nortriptyline (PAMELOR) 25 MG capsule Take 3 capsules (75 mg total) by mouth at bedtime. 07/20/19   Pieter Partridge, DO  tiZANidine  (ZANAFLEX) 2 MG tablet Take by mouth once as needed for muscle spasms. For neck pain    [provider]  TRI-ESTARYLLA 0.18/0.215/0.25 MG-35 MCG tablet Take 1 tablet by mouth daily. 01/30/19   [provider]    Allergies    Patient has no known allergies.  Review of Systems   Review of Systems  Constitutional: Negative for fever.  Respiratory: Negative for shortness of breath.   Gastrointestinal: Negative for abdominal pain, nausea and vomiting.  Genitourinary: Negative for decreased urine volume, difficulty urinating and dysuria.  Musculoskeletal: Positive for back pain. Negative for joint swelling and neck pain.  Skin: Negative for rash.  Neurological: Negative for dizziness, syncope, weakness and numbness.    Physical Exam Updated Vital Signs BP (!) 145/96 (BP Location: Right Arm)   Pulse 84   Temp 98 F (36.7 C) (Oral)   Resp 18   Ht 5\' 6"  (1.676 m)   Wt 107.5 kg   LMP 11/28/2019   SpO2 95%   BMI 38.25 kg/m   Physical Exam Vitals and nursing note reviewed.  Constitutional:      General: She is not in acute distress.    Appearance: She is well-developed. She is not ill-appearing.  HENT:     Head: Normocephalic  and atraumatic.  Cardiovascular:     Rate and Rhythm: Normal rate and regular rhythm.     Pulses: Normal pulses.     Comments: DP pulses are strong and palpable bilaterally Pulmonary:     Effort: Pulmonary effort is normal. No respiratory distress.     Breath sounds: Normal breath sounds.  Abdominal:     General: There is no distension.     Palpations: Abdomen is soft.     Tenderness: There is no abdominal tenderness.  Musculoskeletal:        General: Tenderness and signs of injury present. No swelling.     Cervical back: Normal range of motion and neck supple.     Lumbar back: Tenderness present. No swelling, deformity or lacerations. Normal range of motion.     Comments: ttp of the upper lumbar spine and right lower paraspinal  muscles.  Mild tenderness to palp along the coccyx.  No ecchymosis or abrasion.  Pt has 5/5 strength against resistance of bilateral lower extremities.     Skin:    General: Skin is warm and dry.     Capillary Refill: Capillary refill takes less than 2 seconds.     Findings: No rash.  Neurological:     General: No focal deficit present.     Mental Status: She is alert.     Sensory: No sensory deficit.     Motor: No weakness or abnormal muscle tone.     Deep Tendon Reflexes:     Reflex Scores:      Patellar reflexes are 2+ on the right side and 2+ on the left side.      Achilles reflexes are 2+ on the right side and 2+ on the left side.    ED Results / Procedures / Treatments   Labs (all labs ordered are listed, but only abnormal results are displayed) Labs Reviewed  POC URINE PREG, ED    EKG None  Radiology DG Lumbar Spine Complete  Result Date: 12/19/2019 CLINICAL DATA:  Back pain after fall EXAM: LUMBAR SPINE - COMPLETE 4+ VIEW COMPARISON:  02/20/2016 FINDINGS: There is no evidence of lumbar spine fracture. Alignment is normal. Intervertebral disc spaces are maintained. IMPRESSION: Negative. Electronically Signed   By: Duanne GuessNicholas  Plundo D.O.   On: 12/19/2019 11:35    Procedures Procedures (including critical care time)  Medications Ordered in ED Medications  HYDROcodone-acetaminophen (NORCO/VICODIN) 5-325 MG per tablet 1 tablet (has no administration in time range)    ED Course  I have reviewed the triage vital signs and the nursing notes.  Pertinent labs & imaging results that were available during my care of the patient were reviewed by me and considered in my medical decision making (see chart for details).    MDM Rules/Calculators/A&P                        Pt with low back and tailbone pain secondary to a mechanical fall.  NV intact.  She is ambulatory and gait is slow but steady.  XR reassuring although I have explained that an occult coccygeal fx is  possible.  She appears appropriate for d/c home, agrees to symptomatic tx and close PCP f/u.  Return precautions also discussed.     Final Clinical Impression(s) / ED Diagnoses Final diagnoses:  Fall, initial encounter  Acute midline low back pain without sciatica    Rx / DC Orders ED Discharge Orders    None  Pauline Aus, PA-C 12/20/19 1620    Terrilee Files, MD 12/21/19 2020

## 2019-12-25 ENCOUNTER — Encounter: Payer: Self-pay | Admitting: Family Medicine

## 2019-12-25 ENCOUNTER — Other Ambulatory Visit: Payer: Self-pay

## 2019-12-25 ENCOUNTER — Ambulatory Visit (INDEPENDENT_AMBULATORY_CARE_PROVIDER_SITE_OTHER): Payer: BC Managed Care – PPO | Admitting: Family Medicine

## 2019-12-25 VITALS — BP 130/84 | HR 93 | Ht 66.0 in | Wt 240.2 lb

## 2019-12-25 DIAGNOSIS — M533 Sacrococcygeal disorders, not elsewhere classified: Secondary | ICD-10-CM | POA: Diagnosis not present

## 2019-12-25 DIAGNOSIS — M545 Low back pain, unspecified: Secondary | ICD-10-CM

## 2019-12-25 MED ORDER — HYDROCODONE-ACETAMINOPHEN 7.5-325 MG PO TABS: 1.0000 | ORAL_TABLET | Freq: Four times a day (QID) | ORAL | 0 refills | Status: DC | PRN

## 2019-12-25 NOTE — Progress Notes (Signed)
Subjective:    I'm seeing this patient as a consultation for:  Dr. Tomi Likens. Note will be routed back to referring provider/PCP.  CC: Low back pain  I, Molly Weber, LAT, ATC, am serving as scribe for Dr. Lynne Leader.  HPI: Pt is a 29 y/o female presenting w/ c/o low back pain x 5-6 days.  Her pain recently worsened after she slipped on the ice and fell on her buttocks.  She was seen at [facility] ED on 12/19/19 after slipping and falling on the ice.  She was prescribed Hydrocodone-acetaminophen 7.5-325mg  and Robaxin 500 mg.  Aggravating factors include general movement.  Her symptoms improve w/ L-sidelying.  Since she was seen at the ED, pt reports improvement in her symptoms.  She notes that her mid-back pain is improving but her buttock pain has not changed and she is also having pain along her B sides/flank.  She states that she notes worse pain on the R side of her back.  Prior to her fall, she reports episodes of radiating pain into B LEs.  She denies any current numbness/tingling.  She states that her pain is worse in the morning and with sitting and lumbar flexion.  She states that the muscle relaxers don't help much and reports having finished the hydrocodone.  Past medical history, Surgical history, Family history not pertinant except as noted below, Social history, Allergies, and medications have been entered into the medical record, reviewed, and no changes needed.   Review of Systems: No headache, visual changes, nausea, vomiting, diarrhea, constipation, dizziness, abdominal pain, skin rash, fevers, chills, night sweats, weight loss, swollen lymph nodes, body aches, joint swelling, muscle aches, chest pain, shortness of breath, mood changes, visual or auditory hallucinations.   Objective:    Vitals:   12/25/19 1618  BP: 130/84  Pulse: 93  SpO2: 99%   General: Well Developed, well nourished, and in no acute distress.  Neuro/Psych: Alert and oriented x3, extra-ocular muscles intact,  able to move all 4 extremities, sensation grossly intact. Skin: Warm and dry, no rashes noted.  Respiratory: Not using accessory muscles, speaking in full sentences, trachea midline.  Cardiovascular: Pulses palpable, no extremity edema. Abdomen: Does not appear distended. MSK:  L-spine: Nontender to spinal midline. Tender palpation lumbar paraspinal musculature. Normal lumbar motion. Lower external strength reflexes and sensation equal normal throughout bilateral extremities. Sacrum and coccyx: Tender at coccyx midline. Nontender otherwise. Normal hip and lower extremity motion.  Lab and Radiology Results DG Lumbar Spine Complete  Result Date: 12/19/2019 CLINICAL DATA:  Back pain after fall EXAM: LUMBAR SPINE - COMPLETE 4+ VIEW COMPARISON:  02/20/2016 FINDINGS: There is no evidence of lumbar spine fracture. Alignment is normal. Intervertebral disc spaces are maintained. IMPRESSION: Negative. Electronically Signed   By: Davina Poke D.O.   On: 12/19/2019 11:35   I, Lynne Leader, personally (independently) visualized and performed the interpretation of the images attached in this note.   Impression and Recommendations:    Assessment and Plan: 29 y.o. female with acute coccydynia after fall.  X-ray of the lumbar spine shows the majority of the sacrum and coccyx pretty well with no obvious fracture.  It is doubtful that she has a fracture now however treatment would be similar.  Plan for conservative management with relative rest and donut cushion and continued normal activity.  Recheck back in about a month.  Return sooner if needed.  Patient additionally has an exacerbation of her existing mild chronic back pain.  She likely  will benefit from physical therapy for this.  Plan for course of limited physical therapy and check back in about a month.  We will use limited hydrocodone for pain control along with over-the-counter medications.  Recheck as above.Marland Kitchen  PDMP reviewed during this  encounter. Orders Placed This Encounter  Procedures  . Ambulatory referral to Physical Therapy    Referral Priority:   Routine    Referral Type:   Physical Medicine    Referral Reason:   Specialty Services Required    Requested Specialty:   Physical Therapy    Number of Visits Requested:   1   Meds ordered this encounter  Medications  . HYDROcodone-acetaminophen (NORCO) 7.5-325 MG tablet    Sig: Take 1 tablet by mouth every 6 (six) hours as needed for moderate pain.    Dispense:  10 tablet    Refill:  0    Discussed warning signs or symptoms. Please see discharge instructions. Patient expresses understanding.   The above documentation has been reviewed and is accurate and complete Clementeen Graham

## 2019-12-25 NOTE — Patient Instructions (Signed)
Thank you for coming in today. Use heating pad and TENS unit.  Use a doughnut or similar. Attend PT.  Recheck in 6 weeks.  Return sooner if needed.   TENS UNIT: This is helpful for muscle pain and spasm.   Search and Purchase a TENS 7000 2nd edition at  www.tenspros.com or www.Amazon.com It should be less than $30.     TENS unit instructions: Do not shower or bathe with the unit on Turn the unit off before removing electrodes or batteries If the electrodes lose stickiness add a drop of water to the electrodes after they are disconnected from the unit and place on plastic sheet. If you continued to have difficulty, call the TENS unit company to purchase more electrodes. Do not apply lotion on the skin area prior to use. Make sure the skin is clean and dry as this will help prolong the life of the electrodes. After use, always check skin for unusual red areas, rash or other skin difficulties. If there are any skin problems, does not apply electrodes to the same area. Never remove the electrodes from the unit by pulling the wires. Do not use the TENS unit or electrodes other than as directed. Do not change electrode placement without consultating your therapist or physician. Keep 2 fingers with between each electrode. Wear time ratio is 2:1, on to off times.    For example on for 30 minutes off for 15 minutes and then on for 30 minutes off for 15 minutes     Tailbone Injury  The tailbone (coccyx) is the small bone at the lower end of the spine. A tailbone injury may involve stretched ligaments, bruising, or a broken bone (fracture). Tailbone injuries can be painful, and some may take a long time to heal. What are the causes? This condition may be caused by:  Falling and landing on the tailbone.  Repeated strain or friction from sitting for long periods of time. This may include actions such as rowing and bicycling.  Childbirth. In some cases, the cause may not be known. What  are the signs or symptoms? Symptoms of this condition include:  Pain in the tailbone area or lower back, especially when sitting.  Pain or difficulty when standing up from a sitting position.  Bruising or swelling in the tailbone area.  Painful bowel movements.  In women, pain during intercourse. How is this diagnosed? This condition may be diagnosed based on:  Your symptoms.  A physical exam. If your health care provider suspects a fracture, you may have additional tests, such as:  X-rays.  CT scan.  MRI. How is this treated? Most tailbone injuries heal on their own in 4-6 weeks. However, recovery time may be longer if the injury involves a fracture. Treatment for this condition may include:  NSAIDs or other over-the-counter medicines to help relieve your pain.  Using a large, rubber or inflated ring or cushion to take pressure off the tailbone when sitting.  Physical therapy.  Injecting the tailbone area with local anesthesia and steroid medicine. This is not normally needed unless the pain does not improve over time with over-the-counter pain medicines. Follow these instructions at home: Activity  Avoid sitting for long periods of time.  To prevent repeating an injury that is caused by strain or friction: ? Wear appropriate padding and sports gear when bicycling and rowing.  Increase your activity as the pain allows. Perform any exercises that are recommended by your health care provider or physical therapist.  Managing pain, stiffness, and swelling  To help decrease discomfort when sitting: ? Sit on your rubber or inflated ring or cushion as told by your health care provider. ? Lean forward when you sit.  If directed, apply ice to the injured area: ? Put ice in a plastic bag. ? Place a towel between your skin and the bag. ? Leave the ice on for 20 minutes, 2-3 times per day for the first 1-2 days.  If directed, apply heat to the affected area as often as  told by your health care provider. Use the heat source that your health care provider recommends, such as a moist heat pack or a heating pad. ? Place a towel between your skin and the heat source. ? Leave the heat on for 20-30 minutes. ? Remove the heat if your skin turns bright red. This is especially important if you are unable to feel pain, heat, or cold. You may have a greater risk of getting burned. General instructions  Take over-the-counter and prescription medicines only as told by your health care provider.  To prevent or treat constipation or painful bowel movements, your health care provider may recommend that you: ? Drink enough fluid to keep your urine pale yellow. ? Eat foods that are high in fiber, such as fresh fruits and vegetables, whole grains, and beans. ? Limit foods that are high in fat and processed sugars, such as fried and sweet foods. ? Take an over-the-counter or prescription medicine for constipation.  Keep all follow-up visits as directed by your health care provider. This is important. Contact a health care provider if:  Your pain becomes worse or is not controlled with medicine.  Your bowel movements cause a great deal of discomfort.  You are unable to have a bowel movement after 4 days.  You have pain during intercourse. Summary  A tailbone injury may involve stretched ligaments, bruising, or a broken bone (fracture).  Tailbone injuries can be painful. Most heal on their own in 4-6 weeks.  Treatment may include taking NSAIDs, using a rubber or inflated ring or cushion when sitting, and physical therapy.  Follow any recommendations from your health care provider to prevent or treat constipation. This information is not intended to replace advice given to you by your health care provider. Make sure you discuss any questions you have with your health care provider. Document Revised: 01/04/2018 Document Reviewed: 01/04/2018 Elsevier Patient Education   2020 [employer].

## 2019-12-26 ENCOUNTER — Encounter: Payer: Self-pay | Admitting: Family Medicine

## 2020-02-05 ENCOUNTER — Ambulatory Visit: Payer: BC Managed Care – PPO | Admitting: Family Medicine

## 2020-02-08 ENCOUNTER — Ambulatory Visit: Payer: BC Managed Care – PPO | Admitting: Family Medicine

## 2020-02-15 ENCOUNTER — Ambulatory Visit: Payer: BC Managed Care – PPO | Admitting: Family Medicine

## 2020-02-15 ENCOUNTER — Other Ambulatory Visit: Payer: Self-pay

## 2020-02-15 ENCOUNTER — Encounter: Payer: Self-pay | Admitting: Family Medicine

## 2020-02-15 VITALS — BP 112/80 | HR 76 | Ht 66.0 in | Wt 240.6 lb

## 2020-02-15 DIAGNOSIS — M545 Low back pain, unspecified: Secondary | ICD-10-CM

## 2020-02-15 NOTE — Progress Notes (Signed)
   I, Christoper Fabian, LAT, ATC, am serving as scribe for Dr. Clementeen Graham.  Elizabeth Frank is a 29 y.o. female who presents to [facility] at [facility] today for f/u of low back, sacral and buttocks pain after slipping and falling on ice on 12/19/19.  She was last seen by Dr. Denyse Amass on 12/25/19 and was referred to outpatient PT and advised to use heat, TENs and a doughnut pad.  Since her last visit, pt reports that she is some improved.  Her lower back pain seems more improved than her sacral/tailbone pain.  She states that she purchased a TENs unit and does feel like that helps while it's on but isn't sure if it's helping the problem.  She states that she hasn't been to PT yet and lost her referral sheet.   Pertinent review of systems: No fevers or chills.  Relevant historical information: No significant past medical history.   Exam:  BP 112/80 (BP Location: Left Arm, Patient Position: Sitting, Cuff Size: Large)   Pulse 76   Ht 5\' 6"  (1.676 m)   Wt 240 lb 9.6 oz (109.1 kg)   SpO2 98%   BMI 38.83 kg/m  General: Well Developed, well nourished, and in no acute distress.   MSK: L-spine: Normal-appearing Nontender to spinal midline. Minimally tender paraspinal musculature. Normal lumbar motion. Normal gait.    Lab and Radiology Results EXAM: LUMBAR SPINE - COMPLETE 4+ VIEW  COMPARISON:  02/20/2016  FINDINGS: There is no evidence of lumbar spine fracture. Alignment is normal. Intervertebral disc spaces are maintained.  IMPRESSION: Negative.   Electronically Signed   By: 04/21/2016 D.O.   On: 12/19/2019 11:35  I, 12/21/2019, personally (independently) visualized and performed the interpretation of the images attached in this note.    Assessment and Plan: 29 y.o. female with low back pain after fall in December.  Overall somewhat improved but still somewhat symptomatic.  Patient will continue to benefit from conservative management.  She is not had  trial of physical therapy yet.  New referral placed to physical therapy.  Check back with me as needed in the future if not improved.  Next step would be MRI for injection planning.   PDMP not reviewed this encounter. Orders Placed This Encounter  Procedures  . Ambulatory referral to Physical Therapy    Referral Priority:   Routine    Referral Type:   Physical Medicine    Referral Reason:   Specialty Services Required    Requested Specialty:   Physical Therapy   No orders of the defined types were placed in this encounter.    Discussed warning signs or symptoms. Please see discharge instructions. Patient expresses understanding.   The above documentation has been reviewed and is accurate and complete January

## 2020-02-15 NOTE — Patient Instructions (Signed)
Thank you for coming in today. Attend PT Recheck with me if not better.  Next steps are MRI for injection planning.  Come back or go to the emergency room if you notice new weakness new numbness problems walking or bowel or bladder problems.  TENS UNIT: This is helpful for muscle pain and spasm.    TENS unit instructions: Do not shower or bathe with the unit on Turn the unit off before removing electrodes or batteries If the electrodes lose stickiness add a drop of water to the electrodes after they are disconnected from the unit and place on plastic sheet. If you continued to have difficulty, call the TENS unit company to purchase more electrodes. Do not apply lotion on the skin area prior to use. Make sure the skin is clean and dry as this will help prolong the life of the electrodes. After use, always check skin for unusual red areas, rash or other skin difficulties. If there are any skin problems, does not apply electrodes to the same area. Never remove the electrodes from the unit by pulling the wires. Do not use the TENS unit or electrodes other than as directed. Do not change electrode placement without consultating your therapist or physician. Keep 2 fingers with between each electrode. Wear time ratio is 2:1, on to off times.    For example on for 30 minutes off for 15 minutes and then on for 30 minutes off for 15 minutes

## 2020-02-20 ENCOUNTER — Other Ambulatory Visit: Payer: Self-pay | Admitting: Neurology

## 2020-02-26 ENCOUNTER — Other Ambulatory Visit: Payer: Self-pay

## 2020-02-26 ENCOUNTER — Ambulatory Visit (HOSPITAL_COMMUNITY): Payer: BC Managed Care – PPO | Attending: Family Medicine | Admitting: Physical Therapy

## 2020-02-26 DIAGNOSIS — M5442 Lumbago with sciatica, left side: Secondary | ICD-10-CM | POA: Diagnosis present

## 2020-02-26 DIAGNOSIS — M6281 Muscle weakness (generalized): Secondary | ICD-10-CM | POA: Diagnosis present

## 2020-02-26 DIAGNOSIS — M5441 Lumbago with sciatica, right side: Secondary | ICD-10-CM | POA: Diagnosis present

## 2020-02-27 ENCOUNTER — Encounter (HOSPITAL_COMMUNITY): Payer: Self-pay | Admitting: Physical Therapy

## 2020-02-27 NOTE — Therapy (Signed)
[place], [place], [address] Phone: [phone]   Fax:  [phone]  Physical Therapy Evaluation  Patient Details  Name: Elizabeth Frank MRN: [id number] Date of Birth: 11/25/1991 Referring Provider (PT): Lynne Leader   Encounter Date: 02/26/2020  PT End of Session - 02/27/20 1339    Visit Number  1    Number of Visits  6    Date for PT Re-Evaluation  04/08/20    Authorization Type  BCBS No copay, No VL or AuUth req.    Authorization Time Period  POC dates 3//8/21 to 04/08/20    Progress Note Due on Visit  6    PT Start Time  1615    PT Stop Time  1700    PT Time Calculation (min)  45 min    Activity Tolerance  Patient limited by pain    Behavior During Therapy  [facility] for tasks assessed/performed       Past Medical History:  Diagnosis Date  . Frequent headaches     Past Surgical History:  Procedure Laterality Date  . two teeth removed      There were no vitals filed for this visit.   Subjective Assessment - 02/27/20 1324    Subjective  Patient presents to therapy with complaints of low back pain that started after a fall on 12/19/19. Since the fall she has had pain shoot down both her legs. At first it was just her right side but now it is both. States the xray didn't show anything but she is still having sharp pain. First the pain was localized to the base of her spine (points to PSIS) and now she also has pain about mid back (points to thoracolumbar junction). States that side sitting and shifting her weight seems to help but otherwise she can't get comfortable. States that sitting and driving aggravate her symptoms. States if she stretches (stretches into lumbar extension) her pain decreases. She would really like to have less pain to be able to tolerate daily activities.    Limitations  House hold activities;Walking;Sitting;Standing;Lifting    Currently in Pain?  Yes    Pain Score  1     Pain Location  Back     Pain Orientation  Mid;Lower;Right;Left    Pain Descriptors / Indicators  Sharp;Aching    Pain Type  Acute pain    Pain Radiating Towards  down legs    Pain Onset  More than a month ago    Pain Frequency  Intermittent    Aggravating Factors   walking, sitting, movement    Pain Relieving Factors  stretching backwards         [facility] PT Assessment - 02/27/20 0001      Assessment   Medical Diagnosis  bilateral LBP    Referring Provider (PT)  Lynne Leader    Prior Therapy  no      Precautions   Precautions  None      Restrictions   Weight Bearing Restrictions  No      Balance Screen   Has the patient fallen in the past 6 months  No    Has the patient had a decrease in activity level because of a fear of falling?   Yes    Is the patient reluctant to leave their home because of a fear of falling?   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      Cognition   Overall Cognitive Status  Within Functional Limits for tasks assessed      Observation/Other Assessments   Observations  hyperextension in bilateral knees, decreased lumbar lordosis and flat back posture    Focus on Therapeutic Outcomes (FOTO)   59% limited      ROM / Strength   AROM / PROM / Strength  AROM;Strength      AROM   Overall AROM Comments  guarding with all PROM of hip which increased pain     AROM Assessment Site  Lumbar;Hip    Right/Left Hip  Right;Left    Right Hip Flexion  105   pain in low back    Right Hip External Rotation   45    Right Hip Internal Rotation   30    Left Hip Flexion  110   pain in low back    Left Hip External Rotation   45    Left Hip Internal Rotation   30    Lumbar Flexion  WNL   pain at small of low back   Lumbar Extension  WNL   Hitches at T12 and L5 - feels good   Lumbar - Right Side Bend  50% limited   pain  in left low back    Lumbar - Left Side Bend  50% limited   pain in left low back     Strength   Strength Assessment Site  Lumbar;Hip;Knee    Right/Left Hip  Right;Left    Right Hip Flexion  4/5   pain in low back   Right Hip Extension  4+/5   pain in low back    Right Hip ABduction  4/5   pain in low back    Left Hip Flexion  4+/5    Left Hip Extension  4/5   pain in low back    Left Hip ABduction  4+/5    Right/Left Knee  Left;Right    Right Knee Flexion  4/5   pain in low back    Right Knee Extension  5/5    Left Knee Flexion  4+/5    Left Knee Extension  5/5      Palpation   Spinal mobility  empty end feel with spring testing throughout thoracic and lumbar spine    Palpation comment  Tenderness to palpation along lumbad and thoracic paraspinals, Right glutes and right QL      Special Tests    Special Tests  Sacrolliac Tests    Sacroiliac Tests   Gaenslen's Test      Sacral thrust    Findings  Not tested      Gaenslen's test   Findings  Positive    Comments  felt good with R leg down and left leg up= provocative in opposite posiiton       Sacral Compression   Findings  Unable to test    Comments  tenderness to light pressure/palpation at pelvis                Objective measurements completed on examination: See above findings.      [facility] Adult PT Treatment/Exercise - 02/27/20 0001      Exercises   Exercises  Lumbar      Lumbar Exercises: Stretches   Single Knee to Chest Stretch  5  reps;Left;10 seconds   felt good on left - pain on right     Lumbar Exercises: Prone   Other Prone Lumbar Exercises  prone on elbows - increased painin mid back - unable to tolerate more than 5              PT Education - 02/27/20 1319    Education Details  on current condition, plan moving forward, and answered all questions about POC.    Person(s) Educated  Patient    Methods  Explanation    Comprehension  Verbalized understanding       PT Short Term  Goals - 02/27/20 0948      PT SHORT TERM GOAL #1   Title  Patient will be independent in HEP to improve functional outcomes.    Time  3    Period  Weeks    Status  New    Target Date  03/19/20      PT SHORT TERM GOAL #2   Title  Patient will report at least 25% improvement in overall symptoms to improve QOL    Time  3    Period  Weeks    Status  New    Target Date  03/19/20        PT Long Term Goals - 02/27/20 0952      PT LONG TERM GOAL #1   Title  Patient will report at least 50% improvement in overall symptoms to improve QOL    Time  6    Period  Weeks    Status  New    Target Date  04/08/20      PT LONG TERM GOAL #2   Title  Patient will be able to demonstrate pain free lumbar ROM to improve gross mobility.    Time  6    Period  Weeks    Status  New    Target Date  04/08/20             Plan - 02/27/20 1340    Clinical Impression Statement  Patient presents to therapy with complaints of low back pain after falling on 12/19/19. Patient presents with pain along lumbosacral junctional and lumbothoracic junction which is limiting her ability to participate in daily activities and limits her ability to sit, walk and stand. Patient would benefit from skilled physical therapy focusing on decreasing symptoms and improving overall functional tolerance to improve QOL.    Personal Factors and Comorbidities  Fitness;Finances    Examination-Activity Limitations  Sit;Sleep;Squat;Stand;Locomotion Level;Carry    Examination-Participation Restrictions  Cleaning;Community Activity;Driving;Yard Work;School    Stability/Clinical Decision Making  Stable/Uncomplicated    Clinical Decision Making  Low    Rehab Potential  Good    PT Frequency  1x / week    PT Duration  6 weeks    PT Treatment/Interventions  ADLs/Self Care Home Management;Aquatic Therapy;Cryotherapy;Electrical Stimulation;Moist Heat;Traction;Balance training;Therapeutic exercise;Therapeutic activities;Functional  mobility training;Stair training;Gait training;Neuromuscular re-education;Patient/family education;Manual techniques;Dry needling;Passive range of motion;Spinal Manipulations;Joint Manipulations    PT Next Visit Plan  lumbar mobility, trial traction, SIJ mobs/stretches/MMT realignment, promote right hip ext adn left hip flexion    Consulted and Agree with Plan of Care  Patient       Patient will benefit from skilled therapeutic intervention in order to improve the following deficits and impairments:  Pain, Decreased strength, Difficulty walking, Decreased mobility, Decreased range of motion  Visit Diagnosis: Acute midline low back pain with bilateral sciatica  Muscle weakness (generalized)  Problem List Patient Active Problem List   Diagnosis Date Noted  . History of abnormal Pap smear 04/17/2013    1:49 PM, 02/27/20 Tereasa Coop, DPT Physical Therapy with [facility]  [phone] office  [facility] [address] [place], [place], [address] Phone: [phone]   Fax:  [phone]  Name: [place] M Purdie MRN: [id number] Date of Birth: [birth year]

## 2020-03-05 ENCOUNTER — Ambulatory Visit (HOSPITAL_COMMUNITY): Payer: BC Managed Care – PPO | Admitting: Physical Therapy

## 2020-03-06 ENCOUNTER — Ambulatory Visit (HOSPITAL_COMMUNITY): Payer: BC Managed Care – PPO

## 2020-03-06 ENCOUNTER — Telehealth (HOSPITAL_COMMUNITY): Payer: Self-pay

## 2020-03-06 NOTE — Telephone Encounter (Signed)
No show, called and spoke to pt.  Pt stated scheduling isses made it difficult to make to apt today.  Reminded next apt date and time, verbalized she will make it.    Becky Sax, LPTA/CLT; [place] [phone]

## 2020-03-13 ENCOUNTER — Other Ambulatory Visit: Payer: Self-pay

## 2020-03-13 ENCOUNTER — Encounter (HOSPITAL_COMMUNITY): Payer: Self-pay | Admitting: Physical Therapy

## 2020-03-13 ENCOUNTER — Ambulatory Visit (HOSPITAL_COMMUNITY): Payer: BC Managed Care – PPO | Admitting: Physical Therapy

## 2020-03-13 DIAGNOSIS — M6281 Muscle weakness (generalized): Secondary | ICD-10-CM

## 2020-03-13 DIAGNOSIS — M5441 Lumbago with sciatica, right side: Secondary | ICD-10-CM

## 2020-03-13 DIAGNOSIS — M5442 Lumbago with sciatica, left side: Secondary | ICD-10-CM

## 2020-03-13 NOTE — Therapy (Signed)
[place], [place], [address] Phone: [phone]   Fax:  [phone]  Physical Therapy Treatment  Patient Details  Name: Elizabeth Frank MRN: [id number] Date of Birth: 01-01-1991 Referring Provider (PT): Lynne Leader   Encounter Date: 03/13/2020  PT End of Session - 03/13/20 1616    Visit Number  2    Number of Visits  6    Date for PT Re-Evaluation  04/08/20    Authorization Type  BCBS No copay, No VL or AuUth req.    Authorization Time Period  POC dates 3//8/21 to 04/08/20    Progress Note Due on Visit  6    PT Start Time  1616    PT Stop Time  1656    PT Time Calculation (min)  40 min    Activity Tolerance  Patient limited by pain    Behavior During Therapy  [facility] for tasks assessed/performed       Past Medical History:  Diagnosis Date  . Frequent headaches     Past Surgical History:  Procedure Laterality Date  . two teeth removed      There were no vitals filed for this visit.  Subjective Assessment - 03/13/20 1626    Subjective  Patient very upset today as she is dealing with some things but would like to continue with therapy. States after last session she had increased pain that lasted about a week. States this week things are feeling better. Her pain last week was 6/10 but this today it is about 2/10 in both her mid and low back.    Limitations  House hold activities;Walking;Sitting;Standing;Lifting    Currently in Pain?  Yes    Pain Score  2     Pain Location  Back    Pain Orientation  Mid;Lower;Right    Pain Onset  More than a month ago         [facility] PT Assessment - 03/13/20 0001      Assessment   Medical Diagnosis  bilateral LBP    Referring Provider (PT)  Lynne Leader                   [facility] Adult PT Treatment/Exercise - 03/13/20 0001      Lumbar Exercises: Stretches   Lower Trunk Rotation  5 reps   on ball  - not tolerated    Piriformis Stretch  5 reps;10  seconds;Left;Right   supine in IR and ER; on left 15 reps      Lumbar Exercises: Supine   Other Supine Lumbar Exercises  hip add/abd isometric in 90/90 supported position - 3x5 5" holds , hip exten/flexion isometric in 90/90 supported posiiton 3x5 5" holds B       Lumbar Exercises: Sidelying   Other Sidelying Lumbar Exercises  book stretch - not tolerated      Modalities   Modalities  Moist Heat      Moist Heat Therapy   Number Minutes Moist Heat  15 Minutes   lumbar spine supine   Moist Heat Location  Lumbar Spine      Manual Therapy   Manual Therapy  Soft tissue mobilization    Manual therapy comments  all manual interventions performed independently of other interventions    Soft tissue mobilization  STM not tolerated. IASTM tolerated better with towel for comfort - percussion gun level 9 with round ball on R lumbar paraspinals and QL - patietn in Left side  lying                PT Short Term Goals - 02/27/20 0948      PT SHORT TERM GOAL #1   Title  Patient will be independent in HEP to improve functional outcomes.    Time  3    Period  Weeks    Status  New    Target Date  03/19/20      PT SHORT TERM GOAL #2   Title  Patient will report at least 25% improvement in overall symptoms to improve QOL    Time  3    Period  Weeks    Status  New    Target Date  03/19/20        PT Long Term Goals - 02/27/20 0952      PT LONG TERM GOAL #1   Title  Patient will report at least 50% improvement in overall symptoms to improve QOL    Time  6    Period  Weeks    Status  New    Target Date  04/08/20      PT LONG TERM GOAL #2   Title  Patient will be able to demonstrate pain free lumbar ROM to improve gross mobility.    Time  6    Period  Weeks    Status  New    Target Date  04/08/20            Plan - 03/13/20 1701    Clinical Impression Statement  Focused on pain management strategies today. Moist heat helped but she only tolerated percussion gun with extra  support and in some areas. 90/90 supported position felt good but increased pain noted with hip abd isometric. Traction on ball did not help and pain not changed with SIJ MMT. Decreased pain with piriformis stretching. Patient tends to move trunk with hip mobility which may be contributing to her pain but does not tolerate manual well. Will look into QL stretch in side lying next session.    Personal Factors and Comorbidities  Fitness;Finances    Examination-Activity Limitations  Sit;Sleep;Squat;Stand;Locomotion Level;Carry    Examination-Participation Restrictions  Cleaning;Community Activity;Driving;Yard Work;School    Stability/Clinical Decision Making  Stable/Uncomplicated    Rehab Potential  Good    PT Frequency  1x / week    PT Duration  6 weeks    PT Treatment/Interventions  ADLs/Self Care Home Management;Aquatic Therapy;Cryotherapy;Electrical Stimulation;Moist Heat;Traction;Balance training;Therapeutic exercise;Therapeutic activities;Functional mobility training;Stair training;Gait training;Neuromuscular re-education;Patient/family education;Manual techniques;Dry needling;Passive range of motion;Spinal Manipulations;Joint Manipulations    PT Next Visit Plan  R QL stretch s/l, bridges,  promote right hip ext adn left hip flexion    Consulted and Agree with Plan of Care  Patient       Patient will benefit from skilled therapeutic intervention in order to improve the following deficits and impairments:  Pain, Decreased strength, Difficulty walking, Decreased mobility, Decreased range of motion  Visit Diagnosis: Acute midline low back pain with bilateral sciatica  Muscle weakness (generalized)     Problem List Patient Active Problem List   Diagnosis Date Noted  . History of abnormal Pap smear 04/17/2013    5:05 PM, 03/13/20 Tereasa Coop, DPT Physical Therapy with [facility]  [phone] office  [facility] [address] [place], [place], [address] Phone: [phone]   Fax:  [phone]  Name: Elizabeth Frank MRN: [id number] Date of Birth: 1991/09/25

## 2020-03-19 ENCOUNTER — Ambulatory Visit (HOSPITAL_COMMUNITY): Payer: BC Managed Care – PPO | Admitting: Physical Therapy

## 2020-03-22 ENCOUNTER — Encounter (HOSPITAL_COMMUNITY): Payer: Self-pay | Admitting: Physical Therapy

## 2020-03-22 ENCOUNTER — Other Ambulatory Visit: Payer: Self-pay

## 2020-03-22 ENCOUNTER — Ambulatory Visit (HOSPITAL_COMMUNITY): Payer: BC Managed Care – PPO | Attending: Family Medicine | Admitting: Physical Therapy

## 2020-03-22 DIAGNOSIS — M5442 Lumbago with sciatica, left side: Secondary | ICD-10-CM | POA: Diagnosis present

## 2020-03-22 DIAGNOSIS — M5441 Lumbago with sciatica, right side: Secondary | ICD-10-CM | POA: Insufficient documentation

## 2020-03-22 DIAGNOSIS — M6281 Muscle weakness (generalized): Secondary | ICD-10-CM | POA: Diagnosis present

## 2020-03-22 NOTE — Patient Instructions (Signed)
Access Code: [email or web address] URL: [email or web address] Date: 03/22/2020 Prepared by: Revonda Humphrey  Exercises Seated Hip Hinge with Dowel - 1 x daily - 7 x weekly - 3 sets - 15 reps Standing Shoulder Extension with Dowel - 1 x daily - 7 x weekly - 3 sets - 10 reps - 5 hold

## 2020-03-22 NOTE — Therapy (Signed)
[facility] [address] [place], [place], [address] Phone: [phone]   Fax:  [phone]  Physical Therapy Treatment  Patient Details  Name: Elizabeth Frank MRN: [id number] Date of Birth: 1991/09/07 Referring Provider (PT): Clementeen Graham   Encounter Date: 03/22/2020  PT End of Session - 03/22/20 0916    Visit Number  3    Number of Visits  6    Date for PT Re-Evaluation  04/08/20    Authorization Type  BCBS No copay, No VL or AuUth req.    Authorization Time Period  POC dates 3//8/21 to 04/08/20    Progress Note Due on Visit  6    PT Start Time  0917    PT Stop Time  0957    PT Time Calculation (min)  40 min    Activity Tolerance  Patient limited by pain    Behavior During Therapy  [facility] for tasks assessed/performed       Past Medical History:  Diagnosis Date  . Frequent headaches     Past Surgical History:  Procedure Laterality Date  . two teeth removed      There were no vitals filed for this visit.  Subjective Assessment - 03/22/20 0928    Subjective  States the day after and the day of from last session she was in a good bit of pain. States she has been feeling better since yesterday and has been off from work and she has been active. States at work she sits on the floor, chair is not soft, leaning forward and in in small groups, sitting in tiny chairs.    Limitations  House hold activities;Walking;Sitting;Standing;Lifting    Currently in Pain?  No/denies    Pain Onset  More than a month ago         [facility] PT Assessment - 03/22/20 0001      Assessment   Medical Diagnosis  bilateral LBP    Referring Provider (PT)  Clementeen Graham                   [facility] Adult PT Treatment/Exercise - 03/22/20 0001      Lumbar Exercises: Standing   Other Standing Lumbar Exercises  hip hinge with dowel - 2x15     Other Standing Lumbar Exercises  shoulder extension with dowel 3x10, 5" holds       Lumbar Exercises: Seated    Other Seated Lumbar Exercises  hip hinges seates with sticks 15 minutes; then without stick and tactile cues - another 3x10 focus  on core activation             PT Education - 03/22/20 0932    Education Details  in changing chair/seat cushion at work. HEP    Person(s) Educated  Patient    Methods  Explanation    Comprehension  Verbalized understanding       PT Short Term Goals - 02/27/20 0948      PT SHORT TERM GOAL #1   Title  Patient will be independent in HEP to improve functional outcomes.    Time  3    Period  Weeks    Status  New    Target Date  03/19/20      PT SHORT TERM GOAL #2   Title  Patient will report at least 25% improvement in overall symptoms to improve QOL    Time  3    Period  Weeks    Status  New    Target Date  03/19/20        PT Long Term Goals - 02/27/20 0952      PT LONG TERM GOAL #1   Title  Patient will report at least 50% improvement in overall symptoms to improve QOL    Time  6    Period  Weeks    Status  New    Target Date  04/08/20      PT LONG TERM GOAL #2   Title  Patient will be able to demonstrate pain free lumbar ROM to improve gross mobility.    Time  6    Period  Weeks    Status  New    Target Date  04/08/20            Plan - 03/22/20 6314    Clinical Impression Statement  Focused on hip hinges today secondary to patient needing to bend forward throughout the day at work and this seems to be aggravating movement. Tolerated this well with improved movement into this motion. No increase in pain noted during session. Instructed patient to focus on this movement going forward to improve mechanics with bending forward at work.    Personal Factors and Comorbidities  Fitness;Finances    Examination-Activity Limitations  Sit;Sleep;Squat;Stand;Locomotion Level;Carry    Examination-Participation Restrictions  Cleaning;Community Activity;Driving;Yard Work;School    Stability/Clinical Decision Making  Stable/Uncomplicated     Rehab Potential  Good    PT Frequency  1x / week    PT Duration  6 weeks    PT Treatment/Interventions  ADLs/Self Care Home Management;Aquatic Therapy;Cryotherapy;Electrical Stimulation;Moist Heat;Traction;Balance training;Therapeutic exercise;Therapeutic activities;Functional mobility training;Stair training;Gait training;Neuromuscular re-education;Patient/family education;Manual techniques;Dry needling;Passive range of motion;Spinal Manipulations;Joint Manipulations    PT Next Visit Plan  R QL stretch s/l, bridges,  promote right hip ext adn left hip flexion    PT Home Exercise Plan  hip hinges seated and standing.    Consulted and Agree with Plan of Care  Patient       Patient will benefit from skilled therapeutic intervention in order to improve the following deficits and impairments:  Pain, Decreased strength, Difficulty walking, Decreased mobility, Decreased range of motion  Visit Diagnosis: Acute midline low back pain with bilateral sciatica  Muscle weakness (generalized)     Problem List Patient Active Problem List   Diagnosis Date Noted  . History of abnormal Pap smear 04/17/2013    9:59 AM, 03/22/20 [name], DPT Physical Therapy with [facility]  [phone] office  [place] [address] [place], [place], [address] Phone: [phone]   Fax:  [phone]  Name: Elizabeth Frank MRN: [id number] Date of Birth: June 22, 1991

## 2020-03-25 ENCOUNTER — Telehealth (HOSPITAL_COMMUNITY): Payer: Self-pay | Admitting: Physical Therapy

## 2020-03-25 NOTE — Telephone Encounter (Signed)
pt cancelled appt for tomorrow because she has a dentist appt

## 2020-03-26 ENCOUNTER — Ambulatory Visit (HOSPITAL_COMMUNITY): Payer: BC Managed Care – PPO | Admitting: Physical Therapy

## 2020-03-28 ENCOUNTER — Other Ambulatory Visit: Payer: Self-pay

## 2020-03-28 ENCOUNTER — Ambulatory Visit (HOSPITAL_COMMUNITY): Payer: BC Managed Care – PPO | Admitting: Physical Therapy

## 2020-03-28 ENCOUNTER — Encounter (HOSPITAL_COMMUNITY): Payer: Self-pay | Admitting: Physical Therapy

## 2020-03-28 DIAGNOSIS — M6281 Muscle weakness (generalized): Secondary | ICD-10-CM

## 2020-03-28 DIAGNOSIS — M5442 Lumbago with sciatica, left side: Secondary | ICD-10-CM | POA: Diagnosis not present

## 2020-03-28 DIAGNOSIS — M5441 Lumbago with sciatica, right side: Secondary | ICD-10-CM

## 2020-03-28 NOTE — Therapy (Signed)
[place], [place], [address] Phone: [phone]   Fax:  [phone]  Physical Therapy Treatment  Patient Details  Name: Elizabeth Frank MRN: [id number] Date of Birth: 1991/06/02 Referring Provider (PT): Lynne Leader   Encounter Date: 03/28/2020  PT End of Session - 03/28/20 1009    Visit Number  4    Number of Visits  6    Date for PT Re-Evaluation  04/08/20    Authorization Type  BCBS No copay, No VL or AuUth req.    Authorization Time Period  POC dates 3//8/21 to 04/08/20    Progress Note Due on Visit  6    PT Start Time  1006    PT Stop Time  1045    PT Time Calculation (min)  39 min    Activity Tolerance  Patient limited by pain    Behavior During Therapy  [facility] for tasks assessed/performed       Past Medical History:  Diagnosis Date  . Frequent headaches     Past Surgical History:  Procedure Laterality Date  . two teeth removed      There were no vitals filed for this visit.  Subjective Assessment - 03/28/20 1009    Subjective  left lower back has been bothering her, especially with pressure applied to her. States that she is unsure when it started but does hurt. States she has ben working on her hip hinges and they are getting better. But overall no significant pain.    Limitations  House hold activities;Walking;Sitting;Standing;Lifting    Currently in Pain?  Yes    Pain Score  2     Pain Location  Back    Pain Orientation  Left    Pain Descriptors / Indicators  Aching    Pain Type  Chronic pain    Pain Radiating Towards  left    Pain Onset  More than a month ago         [facility] PT Assessment - 03/28/20 0001      Assessment   Medical Diagnosis  bilateral LBP    Referring Provider (PT)  Lynne Leader                   [facility] Adult PT Treatment/Exercise - 03/28/20 0001      Lumbar Exercises: Standing   Other Standing Lumbar Exercises  glute squeezes in wid sumo stance 3x10, 5"  holds    Other Standing Lumbar Exercises  lunge stretch on 16" heigth x5, 10" holds L       Lumbar Exercises: Prone   Other Prone Lumbar Exercises  hamstring curl 5" holds then hip ER 5" holds then IR 5" holds 3x5 B    Other Prone Lumbar Exercises  prone heel press in frog position - 4x5 5" holds      Manual Therapy   Manual Therapy  Soft tissue mobilization    Manual therapy comments  all manual interventions performed independently of other interventions    Soft tissue mobilization  IASTM percussion gun level 9 with round ball on L lumbar paraspinals and QL patient prone               PT Short Term Goals - 02/27/20 0948      PT SHORT TERM GOAL #1   Title  Patient will be independent in HEP to improve functional outcomes.    Time  3    Period  Weeks  Status  New    Target Date  03/19/20      PT SHORT TERM GOAL #2   Title  Patient will report at least 25% improvement in overall symptoms to improve QOL    Time  3    Period  Weeks    Status  New    Target Date  03/19/20        PT Long Term Goals - 02/27/20 0952      PT LONG TERM GOAL #1   Title  Patient will report at least 50% improvement in overall symptoms to improve QOL    Time  6    Period  Weeks    Status  New    Target Date  04/08/20      PT LONG TERM GOAL #2   Title  Patient will be able to demonstrate pain free lumbar ROM to improve gross mobility.    Time  6    Period  Weeks    Status  New    Target Date  04/08/20            Plan - 03/28/20 1024    Clinical Impression Statement  Focused on left lower back pain. IASTM helped a little but discomfort continued. Switched to knee/hip ROM exercises and left lower pain was not increased. Added glute squeezes and this decreased pain but did not abolish it. Will follow up with hip hinges next session.    Personal Factors and Comorbidities  Fitness;Finances    Examination-Activity Limitations  Sit;Sleep;Squat;Stand;Locomotion Level;Carry     Examination-Participation Restrictions  Cleaning;Community Activity;Driving;Yard Work;School    Stability/Clinical Decision Making  Stable/Uncomplicated    Rehab Potential  Good    PT Frequency  1x / week    PT Duration  6 weeks    PT Treatment/Interventions  ADLs/Self Care Home Management;Aquatic Therapy;Cryotherapy;Electrical Stimulation;Moist Heat;Traction;Balance training;Therapeutic exercise;Therapeutic activities;Functional mobility training;Stair training;Gait training;Neuromuscular re-education;Patient/family education;Manual techniques;Dry needling;Passive range of motion;Spinal Manipulations;Joint Manipulations    PT Next Visit Plan  R QL stretch s/l, bridges,  promote right hip ext adn left hip flexion    PT Home Exercise Plan  hip hinges seated and standing.    Consulted and Agree with Plan of Care  Patient       Patient will benefit from skilled therapeutic intervention in order to improve the following deficits and impairments:  Pain, Decreased strength, Difficulty walking, Decreased mobility, Decreased range of motion  Visit Diagnosis: Acute midline low back pain with bilateral sciatica  Muscle weakness (generalized)     Problem List Patient Active Problem List   Diagnosis Date Noted  . History of abnormal Pap smear 04/17/2013    10:43 AM, 03/28/20 Tereasa Coop, DPT Physical Therapy with [facility]  [phone] office   [facility] [address] [place], [place], [address] Phone: [phone]   Fax:  [phone]  Name: [place] M Rineer MRN: [id number] Date of Birth: 1991-12-13

## 2020-04-02 ENCOUNTER — Ambulatory Visit (HOSPITAL_COMMUNITY): Payer: BC Managed Care – PPO | Admitting: Physical Therapy

## 2020-04-02 ENCOUNTER — Other Ambulatory Visit: Payer: Self-pay

## 2020-04-02 ENCOUNTER — Encounter (HOSPITAL_COMMUNITY): Payer: Self-pay | Admitting: Physical Therapy

## 2020-04-02 DIAGNOSIS — M5441 Lumbago with sciatica, right side: Secondary | ICD-10-CM

## 2020-04-02 DIAGNOSIS — M6281 Muscle weakness (generalized): Secondary | ICD-10-CM

## 2020-04-02 DIAGNOSIS — M5442 Lumbago with sciatica, left side: Secondary | ICD-10-CM | POA: Diagnosis not present

## 2020-04-02 NOTE — Therapy (Signed)
[facility] [address] [place], [place], [address] Phone: [phone]   Fax:  [phone]  Physical Therapy Treatment  Patient Details  Name: Elizabeth Frank MRN: [id number] Date of Birth: 01-20-1991 Referring Provider (PT): Clementeen Graham   Encounter Date: 04/02/2020  PT End of Session - 04/02/20 1611    Visit Number  5    Number of Visits  6    Date for PT Re-Evaluation  04/08/20    Authorization Type  BCBS No copay, No VL or AuUth req.    Authorization Time Period  POC dates 3//8/21 to 04/08/20    Progress Note Due on Visit  6    PT Start Time  1611    PT Stop Time  1651    PT Time Calculation (min)  40 min    Activity Tolerance  Patient limited by pain    Behavior During Therapy  [facility] for tasks assessed/performed       Past Medical History:  Diagnosis Date  . Frequent headaches     Past Surgical History:  Procedure Laterality Date  . two teeth removed      There were no vitals filed for this visit.  Subjective Assessment - 04/02/20 1616    Subjective  States that she has had her interns so she wasn't as stressed. States that she only consciously thought about hip hinging at work once but had less pain and she has been sitting up straighter. States that her acute pain in her one spot resolved.    Limitations  House hold activities;Walking;Sitting;Standing;Lifting    Currently in Pain?  Yes    Pain Score  1     Pain Location  Back    Pain Orientation  Mid    Pain Onset  More than a month ago         [facility] PT Assessment - 04/02/20 0001      Assessment   Medical Diagnosis  bilateral LBP    Referring Provider (PT)  Clementeen Graham                   [facility] Adult PT Treatment/Exercise - 04/02/20 0001      Lumbar Exercises: Supine   Ab Set  --   2 x 2 minute practice sessions, holding 15 seconds   Other Supine Lumbar Exercises  towel roll down spine - vertical - 5 minutes     Other Supine Lumbar Exercises  TRA  contraction - with various cues - then tranisitoned to marches then to bent knee fall outs - practiced in clinc with tactile and verbal cues. -             PT Education - 04/02/20 1645    Education Details  educated patient on TRA anatomy and how it supports low back. in HEP and PN next session    Person(s) Educated  Patient    Methods  Explanation    Comprehension  Verbalized understanding       PT Short Term Goals - 02/27/20 0948      PT SHORT TERM GOAL #1   Title  Patient will be independent in HEP to improve functional outcomes.    Time  3    Period  Weeks    Status  New    Target Date  03/19/20      PT SHORT TERM GOAL #2   Title  Patient will report at least 25% improvement in overall symptoms to  improve QOL    Time  3    Period  Weeks    Status  New    Target Date  03/19/20        PT Long Term Goals - 02/27/20 0952      PT LONG TERM GOAL #1   Title  Patient will report at least 50% improvement in overall symptoms to improve QOL    Time  6    Period  Weeks    Status  New    Target Date  04/08/20      PT LONG TERM GOAL #2   Title  Patient will be able to demonstrate pain free lumbar ROM to improve gross mobility.    Time  6    Period  Weeks    Status  New    Target Date  04/08/20            Plan - 04/02/20 1611    Clinical Impression Statement  Patient improving in overall symptoms and symptom management. Added towel roll exercise which abolished upper back symptoms. Added additional exercises to HEP per patient request. Very difficult for patient to achieve TRA activation in supine but was able to get it with prior demonstration, tactile cues and trying different verbal cues. Fatigue noted end of session with soreness in front and back of abdomen (along TRA muscle). No pain reported end of session. Will reassess patient next session.    Personal Factors and Comorbidities  Fitness;Finances    Examination-Activity Limitations   Sit;Sleep;Squat;Stand;Locomotion Level;Carry    Examination-Participation Restrictions  Cleaning;Community Activity;Driving;Yard Work;School    Stability/Clinical Decision Making  Stable/Uncomplicated    Rehab Potential  Good    PT Frequency  1x / week    PT Duration  6 weeks    PT Treatment/Interventions  ADLs/Self Care Home Management;Aquatic Therapy;Cryotherapy;Electrical Stimulation;Moist Heat;Traction;Balance training;Therapeutic exercise;Therapeutic activities;Functional mobility training;Stair training;Gait training;Neuromuscular re-education;Patient/family education;Manual techniques;Dry needling;Passive range of motion;Spinal Manipulations;Joint Manipulations    PT Next Visit Plan  PN next session. DC if appropriate. seated ball exercises/progress TRA if appropriate.    PT Home Exercise Plan  hip hinges seated and standing. TRA activation    Consulted and Agree with Plan of Care  Patient       Patient will benefit from skilled therapeutic intervention in order to improve the following deficits and impairments:  Pain, Decreased strength, Difficulty walking, Decreased mobility, Decreased range of motion  Visit Diagnosis: Acute midline low back pain with bilateral sciatica  Muscle weakness (generalized)     Problem List Patient Active Problem List   Diagnosis Date Noted  . History of abnormal Pap smear 04/17/2013    4:59 PM, 04/02/20 [name], DPT Physical Therapy with [facility]  [phone] office  [place] [address] [place], [place], [address] Phone: [phone]   Fax:  [phone]  Name: [name] MRN: [id number] Date of Birth: [birth year]

## 2020-04-09 ENCOUNTER — Encounter (HOSPITAL_COMMUNITY): Payer: Self-pay | Admitting: Physical Therapy

## 2020-04-09 ENCOUNTER — Ambulatory Visit (HOSPITAL_COMMUNITY): Payer: BC Managed Care – PPO | Admitting: Physical Therapy

## 2020-04-09 ENCOUNTER — Other Ambulatory Visit: Payer: Self-pay

## 2020-04-09 DIAGNOSIS — M6281 Muscle weakness (generalized): Secondary | ICD-10-CM

## 2020-04-09 DIAGNOSIS — M5442 Lumbago with sciatica, left side: Secondary | ICD-10-CM | POA: Diagnosis not present

## 2020-04-09 DIAGNOSIS — M5441 Lumbago with sciatica, right side: Secondary | ICD-10-CM

## 2020-04-09 NOTE — Therapy (Signed)
[place], [place], [address] Phone: [phone]   Fax:  [phone]  Physical Therapy Treatment, Progress Note and Discharge Note  Patient Details  Name: Elizabeth Frank MRN: [id number] Date of Birth: 06-23-1991 Referring Provider (PT): Lynne Leader  Progress Note Reporting Period 02/26/20 to 04/09/20  See note below for Objective Data and Assessment of Progress/Goals.   PHYSICAL THERAPY DISCHARGE SUMMARY  Visits from Start of Care:6   Current functional level related to goals / functional outcomes: See below    Remaining deficits: occasional pain   Education / Equipment: See below Plan: Patient agrees to discharge.  Patient goals were partially met. Patient is being discharged due to being pleased with the current functional level.  ?????         Encounter Date: 04/09/2020  PT End of Session - 04/09/20 1608    Visit Number  6    Number of Visits  6    Date for PT Re-Evaluation  04/08/20    Authorization Type  BCBS No copay, No VL or AuUth req.    Authorization Time Period  POC dates 3//8/21 to 04/08/20    Progress Note Due on Visit  6    PT Start Time  1610    PT Stop Time  1648    PT Time Calculation (min)  38 min    Activity Tolerance  Patient limited by pain    Behavior During Therapy  [facility] for tasks assessed/performed       Past Medical History:  Diagnosis Date  . Frequent headaches     Past Surgical History:  Procedure Laterality Date  . two teeth removed      There were no vitals filed for this visit.  Subjective Assessment - 04/09/20 1607    Subjective  Fell last week and her back hurt a little bit with the jarring but has been doing her exercises and they seem to be helping. States  she has been focusing on her posture and hinging at hip. Reports 1/20 pain today. Patient reports she is 75-80% better.    Limitations  House hold activities;Walking;Sitting;Standing;Lifting    Currently in Pain?  Yes    Pain Score  1     Pain Location  Back    Pain Onset  More than a month ago         [facility] PT Assessment - 04/09/20 0001      Assessment   Medical Diagnosis  bilateral LBP    Referring Provider (PT)  Lynne Leader      Observation/Other Assessments   Focus on Therapeutic Outcomes (FOTO)   31% limited   was 59% limited     AROM   Lumbar Flexion  WNL   no pain   Lumbar Extension  WNL   no pain    Lumbar - Right Side Bend  WNL   no pain   Lumbar - Left Side Bend  WNL   no pain   Lumbar - Right Rotation  WNL    mild discomfort in mid upper back    Lumbar - Left Rotation  WNL   mild discomfort in mid upper back      Strength   Right Hip Flexion  5/5    Right Hip Extension  4+/5    Right Hip ABduction  4+/5    Left Hip Flexion  5/5    Left Hip Extension  4+/5    Left Hip  ABduction  4+/5    Right Knee Flexion  5/5    Right Knee Extension  5/5    Left Knee Flexion  5/5    Left Knee Extension  5/5                   [facility] Adult PT Treatment/Exercise - 04/09/20 0001      Lumbar Exercises: Supine   Other Supine Lumbar Exercises  bridges 4x5, 5" holds ; roll towel and place perpendicular to upper spine near neck- to let neck stretch backwards -     Other Supine Lumbar Exercises  TRA contraction - isometric, marching, bent knee fall outs, post pelvic tilt, heel slides.             PT Education - 04/09/20 1657    Education Details  on progress, reviewed HEP, answered all questions.    Person(s) Educated  Patient    Methods  Explanation    Comprehension  Verbalized understanding       PT Short Term Goals - 04/09/20 1625      PT SHORT TERM GOAL #1   Title  Patient will be independent in HEP to improve functional outcomes.    Time  3    Period  Weeks    Status  Achieved    Target Date  03/19/20      PT SHORT TERM GOAL #2   Title  Patient will report at least 25% improvement in overall symptoms to improve QOL    Baseline   75% better    Time  3    Period  Weeks    Status  Achieved    Target Date  03/19/20        PT Long Term Goals - 04/09/20 1623      PT LONG TERM GOAL #1   Title  Patient will report at least 50% improvement in overall symptoms to improve QOL    Baseline  75% better    Time  6    Period  Weeks    Status  Achieved      PT LONG TERM GOAL #2   Title  Patient will be able to demonstrate pain free lumbar ROM to improve gross mobility.    Baseline  - minor discomfort with rotation    Time  6    Period  Weeks    Status  Partially Met            Plan - 04/09/20 1658    Personal Factors and Comorbidities  Fitness;Finances    Examination-Activity Limitations  Sit;Sleep;Squat;Stand;Locomotion Level;Carry    Examination-Participation Restrictions  Cleaning;Community Activity;Driving;Yard Work;School    Stability/Clinical Decision Making  Stable/Uncomplicated    Rehab Potential  Good    PT Frequency  1x / week    PT Duration  6 weeks    PT Treatment/Interventions  ADLs/Self Care Home Management;Aquatic Therapy;Cryotherapy;Electrical Stimulation;Moist Heat;Traction;Balance training;Therapeutic exercise;Therapeutic activities;Functional mobility training;Stair training;Gait training;Neuromuscular re-education;Patient/family education;Manual techniques;Dry needling;Passive range of motion;Spinal Manipulations;Joint Manipulations    PT Next Visit Plan  DC To HEP    PT Home Exercise Plan  hip hinges seated and standing. TRA activation, bridges    Consulted and Agree with Plan of Care  Patient       Patient will benefit from skilled therapeutic intervention in order to improve the following deficits and impairments:  Pain, Decreased strength, Difficulty walking, Decreased mobility, Decreased range of motion  Visit Diagnosis: Acute midline low back pain with bilateral sciatica  Muscle weakness (generalized)     Problem List Patient Active Problem List   Diagnosis Date Noted  .  History of abnormal Pap smear 04/17/2013    4:59 PM, 04/09/20 [name], DPT Physical Therapy with [facility]  [phone] office  [place] [address] [place], [place], [address] Phone: [phone]   Fax:  [phone]  Name: Elizabeth Frank MRN: [id number] Date of Birth: 09/12/1991

## 2020-04-15 ENCOUNTER — Other Ambulatory Visit: Payer: Self-pay | Admitting: Neurology

## 2020-05-21 ENCOUNTER — Other Ambulatory Visit: Payer: Self-pay | Admitting: Neurology

## 2020-06-06 ENCOUNTER — Telehealth: Payer: Self-pay | Admitting: Neurology

## 2020-06-06 NOTE — Telephone Encounter (Signed)
Returning a call to the office per Answering service   Called patient to confirm appt

## 2020-06-10 NOTE — Progress Notes (Signed)
Virtual Visit via Video Note The purpose of this virtual visit is to provide medical care while limiting exposure to the novel coronavirus.    Consent was obtained for video visit:  Yes.   Answered questions that patient had about telehealth interaction:  Yes.   I discussed the limitations, risks, security and privacy concerns of performing an evaluation and management service by telemedicine. I also discussed with the patient that there may be a patient responsible charge related to this service. The patient expressed understanding and agreed to proceed.  Pt location: Home Physician Location: office Name of referring provider:  No ref. provider found I connected with Elizabeth Frank at patients initiation/request on 06/11/2020 at  3:30 PM EDT by video enabled telemedicine application and verified that I am speaking with the correct person using two identifiers. Pt MRN:  [id number] Pt DOB:  July 22, 1991 Video Participants:  Elizabeth Frank;   History of Present Illness:  Elizabeth Frank is a 29year old right-handed female who follows up for migraines.  UPDATE: Intensity:  moderate Duration:  30 to 60 minutes Frequency:  4 or 5 headaches a month (1 are migraine)  Current NSAIDs:  Ibuprofen Current analgesic:  Excedrin Current antidepressant:  Nortriptyline 75mg  Birth control: Tri-Estarylla  She has seen Dr. and been to physical therapy for her back pain, which has been effective.  She did aggravate her back pain a few days ago while helping her mom lift something, but that has calmed down.  HISTORY: She has longstanding history of neck and back pain.When she gets the neck pain, she develops a headache radiating from the back of her head up to the front, bilaterally.It is usually an ache but sometimes throbbing.When severe, there may be photophobia or phonophobia but no nausea or visual disturbance.It is about 6-7/10 intensity.Initially, it lasts a couple of hours and  occurs 4 to 5 times a week.Headaches had subsequently been well-controlled on nortriptyline. She self-discontinued nortriptyline in 2019 since the headaches had resolved. In September 2019, she had increased emotional stress after she broke up with her boyfriend. Since then, she reports increased frequency of headaches, including migraines.Migraines became 8-9/10 intensity and associated with blurred vision, photophobia, phonophobia and sometimes nausea (no vomiting or unilateral numbness or weakness). They would typically last all day. Her typical headaches are6-7/10, last2 hours if treated but may last up to all day. She has had 25 headache days in past 30 days, about 4 were what she calls "migraines".She has been treating headaches with ibuprofen or Goody powder about 3 to 4 days a week. She has had increased neck and back pain as well.   Past analgesics:  Goody powder Past triptan:  Rizatriptan (side effects) Past muscle relaxant: cyclobenzaprine  X-rays of the cervical and lumbar spines from 02/21/16 were normal.  Past Medical History: Past Medical History:  Diagnosis Date  . Frequent headaches     Medications: Outpatient Encounter Medications as of 06/11/2020  Medication Sig  . Esomeprazole Magnesium (NEXIUM PO) Take by mouth. Patient unsure of dose, its over the counter  . ibuprofen (ADVIL,MOTRIN) 200 MG tablet Take 200 mg by mouth every 6 (six) hours as needed.  . Norgestimate-Ethinyl Estradiol Triphasic (TRI-ESTARYLLA) 0.18/0.215/0.25 MG-35 MCG tablet Take by mouth.  . nortriptyline (PAMELOR) 25 MG capsule TAKE 3 CAPSULES BY MOUTH AT BEDTIME  . TRI-ESTARYLLA 0.18/0.215/0.25 MG-35 MCG tablet Take 1 tablet by mouth daily.  . methocarbamol (ROBAXIN) 500 MG tablet Take 1 tablet (500 mg total) by mouth  3 (three) times daily. (Patient not taking: Reported on 06/11/2020)   No facility-administered encounter medications on file as of 06/11/2020.    Allergies: No Known  Allergies  Family History: Family History  Problem Relation Age of Onset  . Hypertension Father   . Obesity Sister     Social History: Social History   Socioeconomic History  . Marital status: Single    Spouse name: Not on file  . Number of children: 0  . Years of education: 16  . Highest education level: Bachelor's degree (e.g., BA, AB, BS)  Occupational History  . Occupation: Product manager: [employer]  Tobacco Use  . Smoking status: Never Smoker  . Smokeless tobacco: Never Used  Vaping Use  . Vaping Use: Never used  Substance and Sexual Activity  . Alcohol use: No  . Drug use: Yes    Types: Marijuana  . Sexual activity: Not on file  Other Topics Concern  . Not on file  Social History Narrative   Patient is left-handed. She lives with friends in a one level home. She occasionally drinks caffeine. She does not exercise.    Social Determinants of Health   Financial Resource Strain:   . Difficulty of Paying Living Expenses:   Food Insecurity:   . Worried About Charity fundraiser in the Last Year:   . Arboriculturist in the Last Year:   Transportation Needs:   . Film/video editor (Medical):   Marland Kitchen Lack of Transportation (Non-Medical):   Physical Activity:   . Days of Exercise per Week:   . Minutes of Exercise per Session:   Stress:   . Feeling of Stress :   Social Connections:   . Frequency of Communication with Friends and Family:   . Frequency of Social Gatherings with Friends and Family:   . Attends Religious Services:   . Active Member of Clubs or Organizations:   . Attends Archivist Meetings:   Marland Kitchen Marital Status:   Intimate Partner Violence:   . Fear of Current or Ex-Partner:   . Emotionally Abused:   Marland Kitchen Physically Abused:   . Sexually Abused:     Observations/Objective:   Height 5\' 6"  (1.676 m), weight 235 lb (106.6 kg). No acute distress.  Alert and oriented.  Speech fluent and not dysarthric.  Language intact.    Assessment and Plan:   Migraine without aura, without status migrainosus, not intractable. Tension-type headache, not intractable  1.  For preventative management, nortriptyline 75mg  at bedtime (refilled) 2.  Limit use of pain relievers to no more than 2 days out of week to prevent risk of rebound or medication-overuse headache. 3.  Keep headache diary 4.  Exercise, hydration, caffeine cessation, sleep hygiene, monitor for and avoid triggers 5.  Follow up 9 months.   Follow Up Instructions:    -I discussed the assessment and treatment plan with the patient. The patient was provided an opportunity to ask questions and all were answered. The patient agreed with the plan and demonstrated an understanding of the instructions.   The patient was advised to call back or seek an in-person evaluation if the symptoms worsen or if the condition fails to improve as anticipated.     Dudley Major, DO

## 2020-06-11 ENCOUNTER — Telehealth (INDEPENDENT_AMBULATORY_CARE_PROVIDER_SITE_OTHER): Payer: BC Managed Care – PPO | Admitting: Neurology

## 2020-06-11 ENCOUNTER — Encounter: Payer: Self-pay | Admitting: Neurology

## 2020-06-11 ENCOUNTER — Other Ambulatory Visit: Payer: Self-pay

## 2020-06-11 VITALS — Ht 66.0 in | Wt 235.0 lb

## 2020-06-11 DIAGNOSIS — G44219 Episodic tension-type headache, not intractable: Secondary | ICD-10-CM | POA: Diagnosis not present

## 2020-06-11 DIAGNOSIS — G43009 Migraine without aura, not intractable, without status migrainosus: Secondary | ICD-10-CM

## 2020-06-11 MED ORDER — NORTRIPTYLINE HCL 25 MG PO CAPS: 75.0000 mg | ORAL_CAPSULE | Freq: Every day | ORAL | 8 refills | Status: DC

## 2020-10-03 ENCOUNTER — Ambulatory Visit
Admission: EM | Admit: 2020-10-03 | Discharge: 2020-10-03 | Disposition: A | Discharge status: Home / Self Care | Payer: BC Managed Care – PPO | Attending: Emergency Medicine | Admitting: Emergency Medicine

## 2020-10-03 ENCOUNTER — Other Ambulatory Visit: Payer: Self-pay

## 2020-10-03 DIAGNOSIS — M79672 Pain in left foot: Secondary | ICD-10-CM | POA: Diagnosis not present

## 2020-10-03 MED ORDER — PREDNISONE 10 MG PO TABS: 20.0000 mg | ORAL_TABLET | Freq: Every day | ORAL | 0 refills | Status: DC

## 2020-10-03 MED ORDER — IBUPROFEN 800 MG PO TABS: 800.0000 mg | ORAL_TABLET | Freq: Three times a day (TID) | ORAL | 0 refills | Status: DC

## 2020-10-03 NOTE — Discharge Instructions (Addendum)
Prednisone was prescribed take as directed Ibuprofen 800 mg prescribed take as directed with food Follow RICE instruction that is attached  follow-up with PCP Return to ED for worsening symptoms

## 2020-10-03 NOTE — ED Provider Notes (Signed)
[facility]   [id number] 10/03/20 Arrival Time: 1058   Chief Complaint  Patient presents with   Foot Pain     SUBJECTIVE: History from: patient.  Elizabeth Frank is a 29 y.o. female presented to the urgent care for complaint of left foot pain for the past 3 weeks.  States a kid stepped on her foot.  Elizabeth Frank localizes the pain to the left foot.  Elizabeth Frank describes the pain as constant and achy.  Elizabeth Frank has tried OTC medications without relief.  Her symptoms are made worse with ROM.  Elizabeth Frank denies chills, fever, nausea, vomiting, diarrhea denies similar symptoms in the past.      ROS: As per HPI.  All other pertinent ROS negative.     Past Medical History:  Diagnosis Date   Frequent headaches    Past Surgical History:  Procedure Laterality Date   two teeth removed     No Known Allergies No current facility-administered medications on file prior to encounter.   Current Outpatient Medications on File Prior to Encounter  Medication Sig Dispense Refill   Esomeprazole Magnesium (NEXIUM PO) Take by mouth. Patient unsure of dose, its over the counter     methocarbamol (ROBAXIN) 500 MG tablet Take 1 tablet (500 mg total) by mouth 3 (three) times daily. (Patient not taking: Reported on 06/11/2020) 21 tablet 0   Norgestimate-Ethinyl Estradiol Triphasic (TRI-ESTARYLLA) 0.18/0.215/0.25 MG-35 MCG tablet Take by mouth.     nortriptyline (PAMELOR) 25 MG capsule Take 3 capsules (75 mg total) by mouth at bedtime. 90 capsule 8   TRI-ESTARYLLA 0.18/0.215/0.25 MG-35 MCG tablet Take 1 tablet by mouth daily.     Social History   Socioeconomic History   Marital status: Single    Spouse name: Not on file   Number of children: 0   Years of education: 16   Highest education level: Bachelor's degree (e.g., BA, AB, BS)  Occupational History   Occupation: Magazine features editor: [employer]  Tobacco Use   Smoking status: Never Smoker   Smokeless tobacco: Never Used  Water quality scientist Use: Never used  Substance and Sexual Activity   Alcohol use: No   Drug use: Yes    Types: Marijuana   Sexual activity: Not on file  Other Topics Concern   Not on file  Social History Narrative   Patient is left-handed. Elizabeth Frank lives with friends in a one level home. Elizabeth Frank occasionally drinks caffeine. Elizabeth Frank does not exercise.    Social Determinants of Health   Financial Resource Strain:    Difficulty of Paying Living Expenses: Not on file  Food Insecurity:    Worried About Programme researcher, broadcasting/film/video in the Last Year: Not on file   [employer] of Food in the Last Year: Not on file  Transportation Needs:    Lack of Transportation (Medical): Not on file   Lack of Transportation (Non-Medical): Not on file  Physical Activity:    Days of Exercise per Week: Not on file   Minutes of Exercise per Session: Not on file  Stress:    Feeling of Stress : Not on file  Social Connections:    Frequency of Communication with Friends and Family: Not on file   Frequency of Social Gatherings with Friends and Family: Not on file   Attends Religious Services: Not on file   Active Member of Clubs or Organizations: Not on file   Attends Banker Meetings: Not on file  Marital Status: Not on file  Intimate Partner Violence:    Fear of Current or Ex-Partner: Not on file   Emotionally Abused: Not on file   Physically Abused: Not on file   Sexually Abused: Not on file   Family History  Problem Relation Age of Onset   Hypertension Father    Obesity Sister     OBJECTIVE:  Vitals:   10/03/20 1104  BP: 118/83  Pulse: 90  Resp: 15  Temp: 98.4 F (36.9 C)  SpO2: 99%     Physical Exam Vitals and nursing note reviewed.  Constitutional:      General: Elizabeth Frank is not in acute distress.    Appearance: Normal appearance. Elizabeth Frank is normal weight. Elizabeth Frank is not ill-appearing, toxic-appearing or diaphoretic.  HENT:     Head: Normocephalic.  Cardiovascular:     Rate and  Rhythm: Normal rate and regular rhythm.     Pulses: Normal pulses.     Heart sounds: Normal heart sounds. No murmur heard.  No friction rub. No gallop.   Pulmonary:     Effort: Pulmonary effort is normal. No respiratory distress.     Breath sounds: Normal breath sounds. No stridor. No wheezing, rhonchi or rales.  Chest:     Chest wall: No tenderness.  Musculoskeletal:        General: Tenderness present.     Right foot: Normal.     Left foot: Tenderness present.     Comments: The left foot is without any obvious asymmetry or deformity when compared to the right foot.  There is no ecchymosis, open wound, lesion, warmth, surface trauma present.  Normal range of motion.  Neurovascular status intact.  Neurological:     Mental Status: Elizabeth Frank is alert and oriented to person, place, and time.      LABS:  No results found for this or any previous visit (from the past 24 hour(s)).   ASSESSMENT & PLAN:  1. Left foot pain     Meds ordered this encounter  Medications   predniSONE (DELTASONE) 10 MG tablet    Sig: Take 2 tablets (20 mg total) by mouth daily.    Dispense:  15 tablet    Refill:  0   ibuprofen (ADVIL) 800 MG tablet    Sig: Take 1 tablet (800 mg total) by mouth 3 (three) times daily.    Dispense:  30 tablet    Refill:  0    Discharge instructions  Prednisone was prescribed take as directed Ibuprofen 800 mg prescribed take as directed with food Follow RICE instruction that is attached  follow-up with PCP Return to ED for worsening symptoms  Reviewed expectations re: course of current medical issues. Questions answered. Outlined signs and symptoms indicating need for more acute intervention. Patient verbalized understanding. After Visit Summary given.         Durward Parcel, FNP 10/03/20 1126

## 2020-10-03 NOTE — ED Triage Notes (Signed)
Pt states left foot was stepped on about 3 weeks ago, continues to have pain

## 2021-02-06 ENCOUNTER — Other Ambulatory Visit (HOSPITAL_COMMUNITY): Payer: Self-pay | Admitting: Family Medicine

## 2021-02-06 ENCOUNTER — Ambulatory Visit (HOSPITAL_COMMUNITY)
Admission: RE | Admit: 2021-02-06 | Discharge: 2021-02-06 | Disposition: A | Discharge status: Home / Self Care | Payer: BC Managed Care – PPO | Source: Ambulatory Visit | Attending: Family Medicine | Admitting: Family Medicine

## 2021-02-06 ENCOUNTER — Other Ambulatory Visit: Payer: Self-pay

## 2021-02-06 DIAGNOSIS — M79672 Pain in left foot: Secondary | ICD-10-CM

## 2021-02-06 IMAGING — DX DG FOOT COMPLETE 3+V*L*
3 series · 3 of 3 positions shown · non-contrast
Comparison: None.

CLINICAL DATA: Pain of the plantar surface near the heel.

EXAM:
LEFT ANKLE COMPLETE - 3+ VIEW; LEFT FOOT - COMPLETE 3+ VIEW

[foot ap]
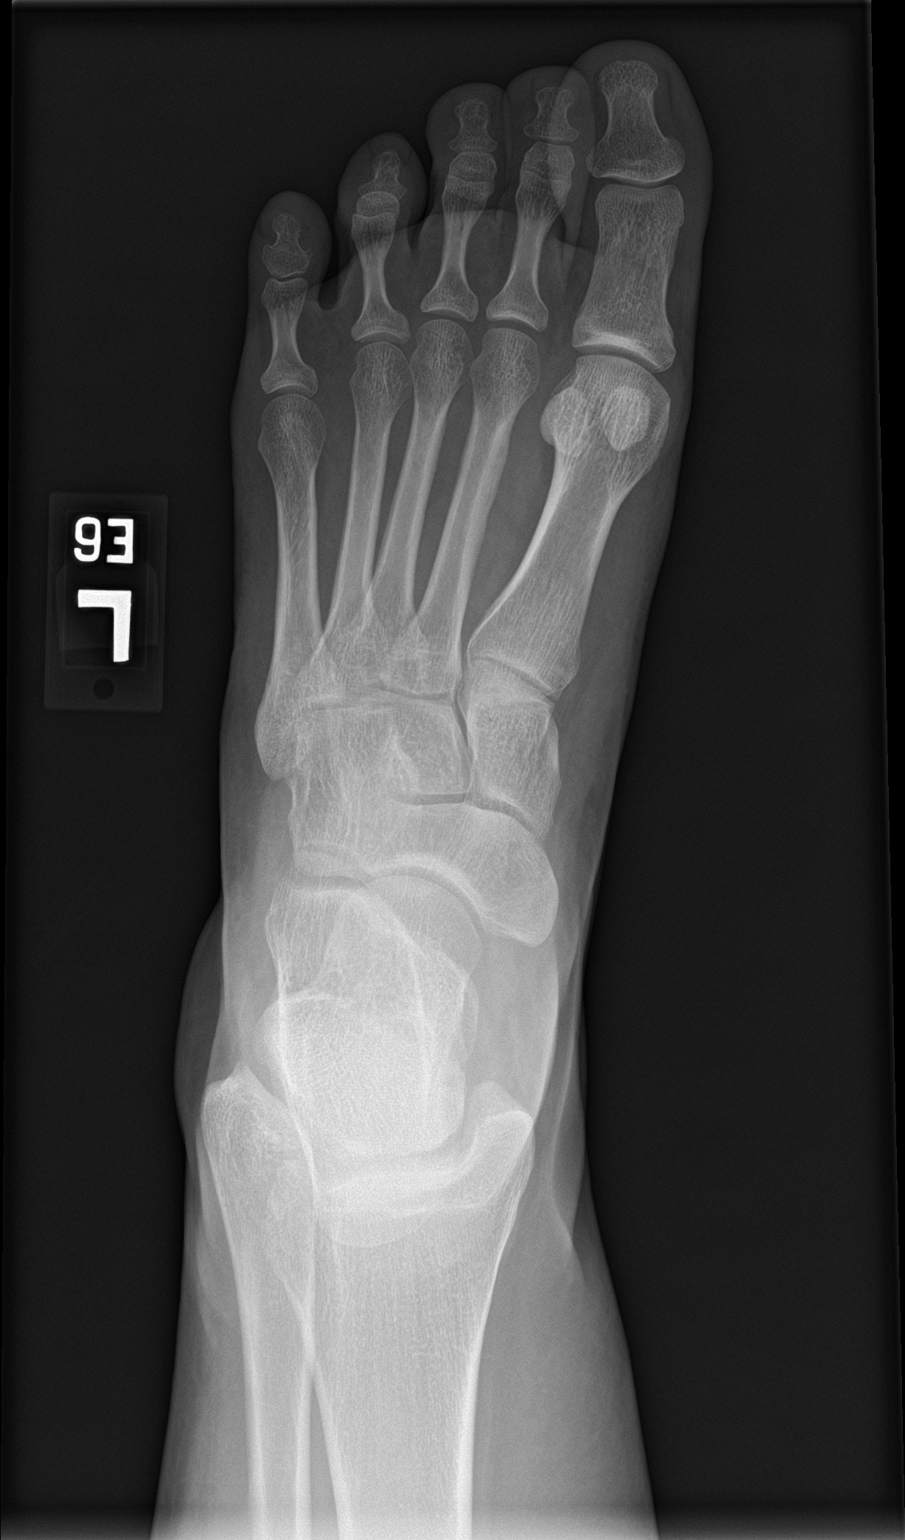

[foot obl]
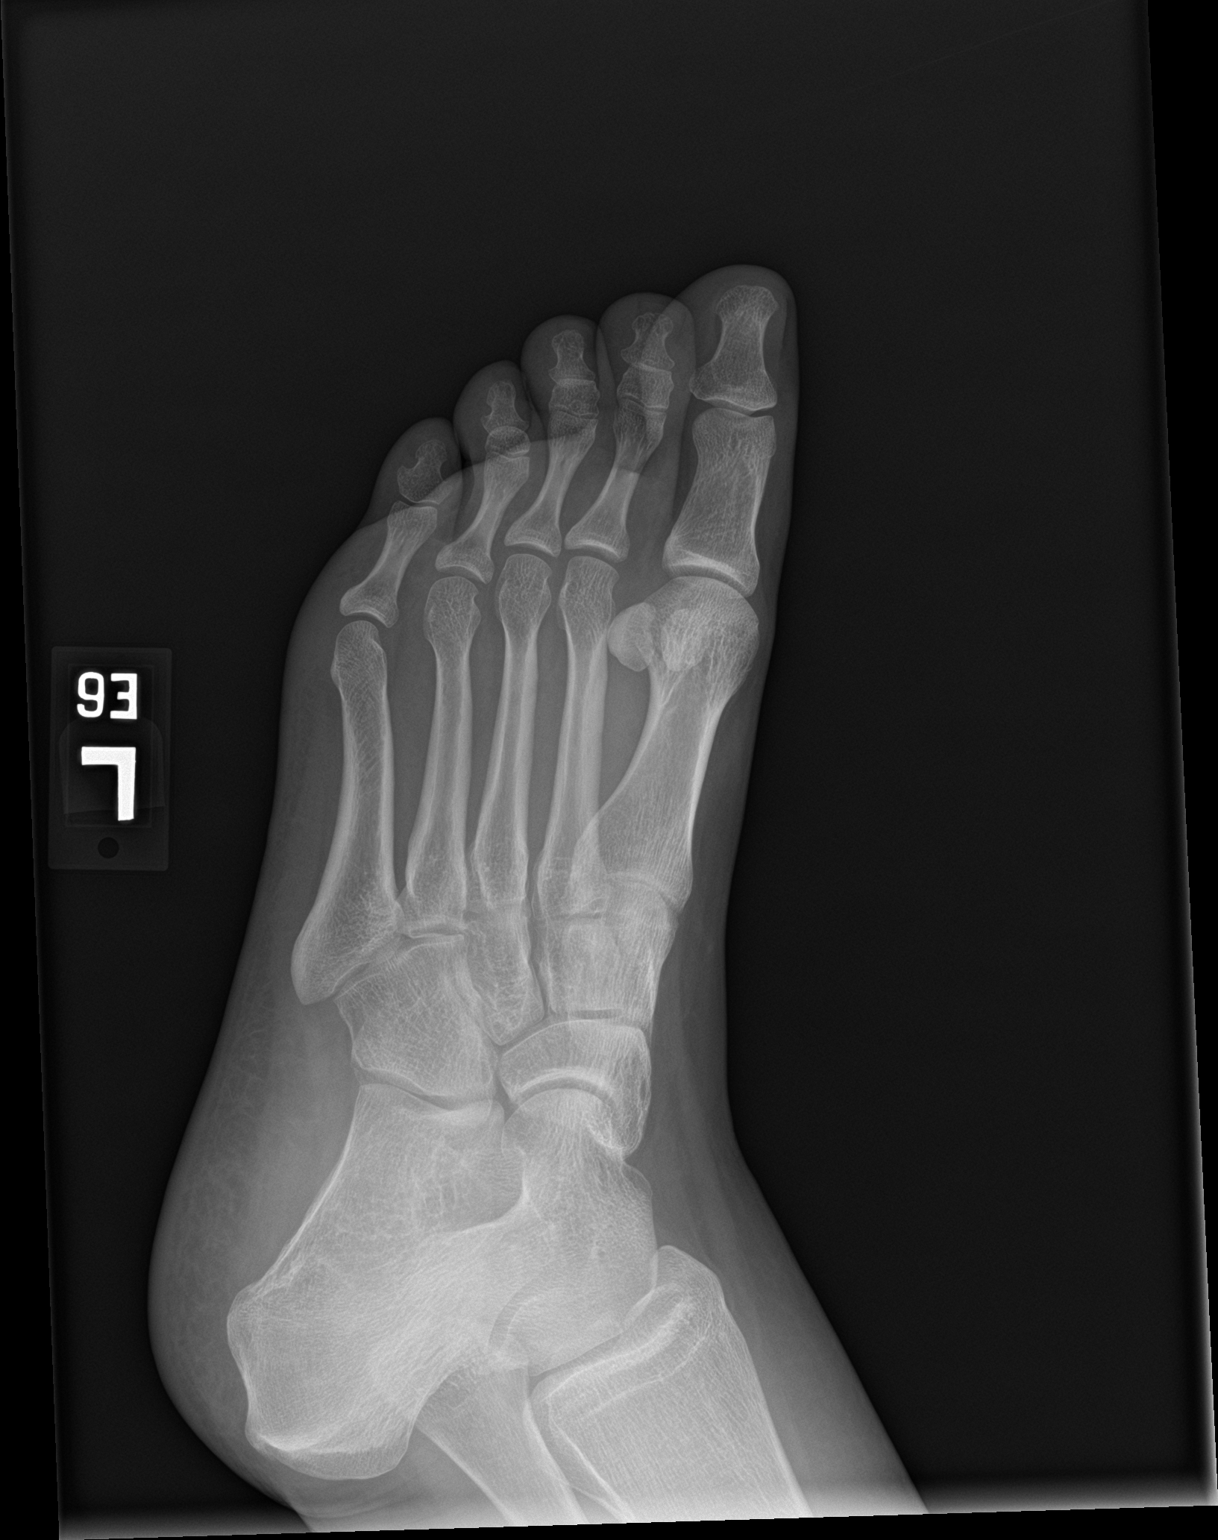

[foot lat]
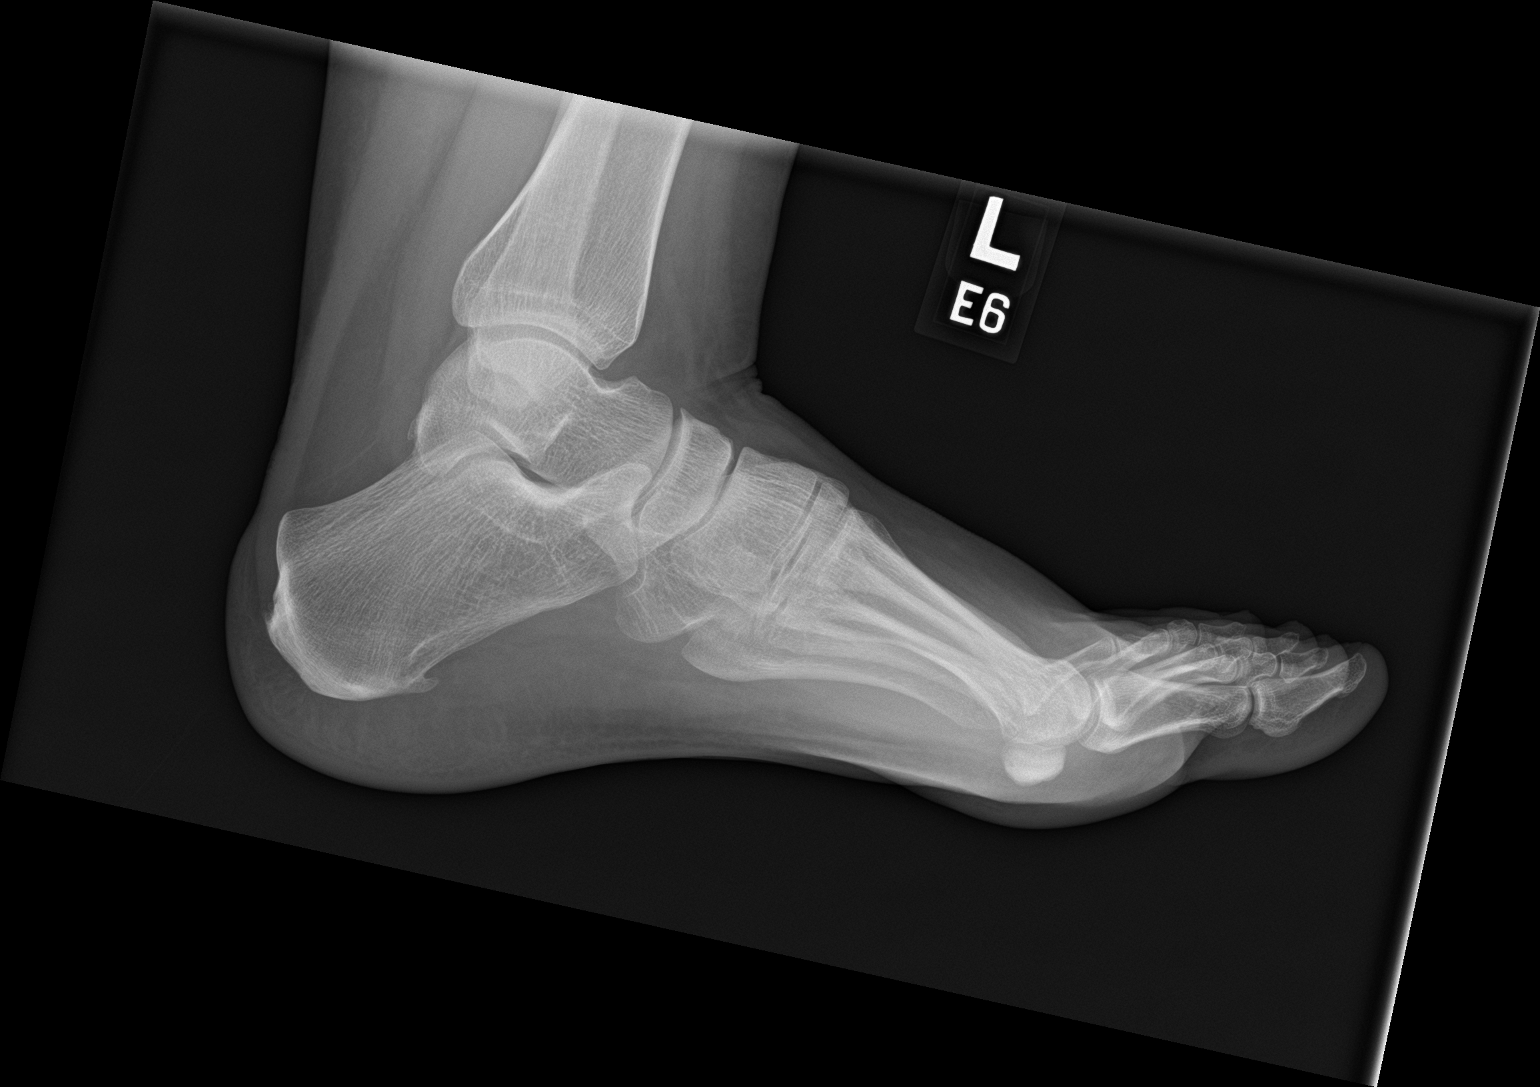

[3 of 3 positions shown; findings below may reference images not displayed]

FINDINGS: There is no evidence of fracture, dislocation, or joint effusion.
There is no evidence of arthropathy or other focal bone abnormality.
Soft tissues are unremarkable. There is a small plantar calcaneal
spur. There is a tiny Achilles tendon enthesophyte.
IMPRESSION: 1. Small plantar calcaneal spur.
2. Tiny Achilles tendon enthesophyte.
3. No acute osseous abnormality.

## 2021-02-06 IMAGING — DX DG ANKLE COMPLETE 3+V*L*
3 series · 3 of 3 positions shown · non-contrast
Comparison: None.

CLINICAL DATA: Pain of the plantar surface near the heel.

EXAM:
LEFT ANKLE COMPLETE - 3+ VIEW; LEFT FOOT - COMPLETE 3+ VIEW

[ankle ap]
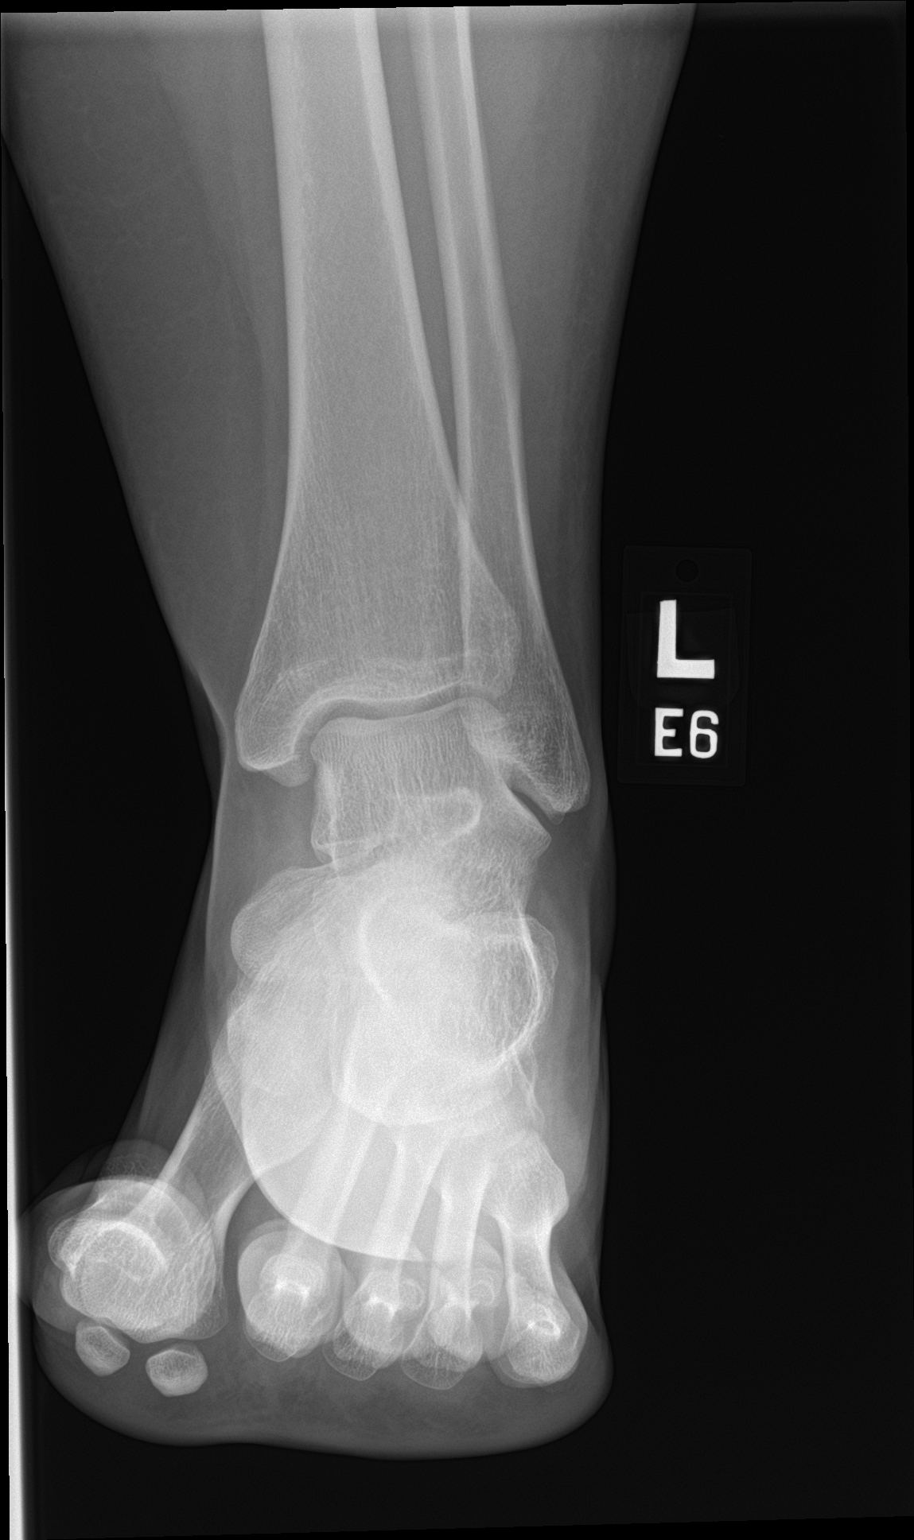

[ankle lat]
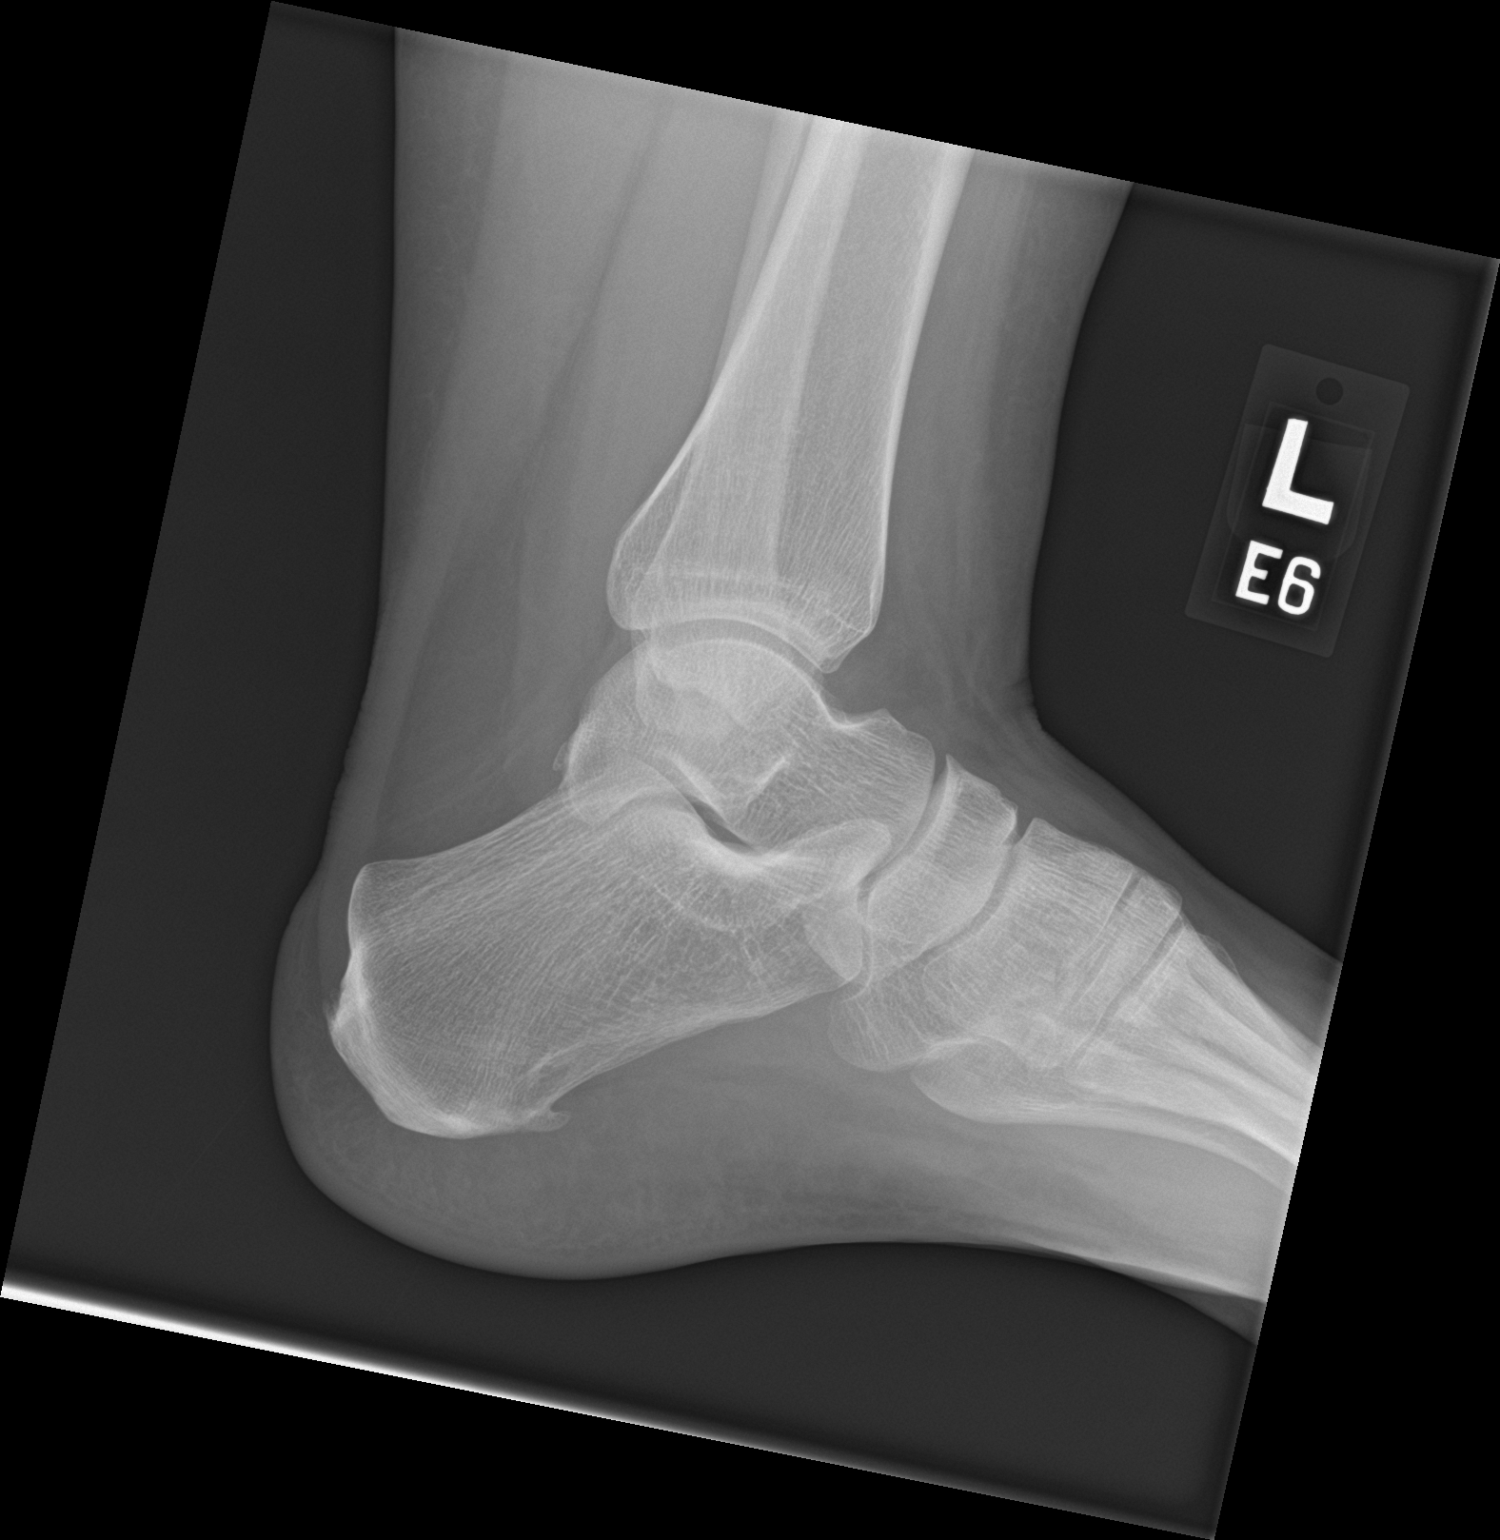

[ankle obl]
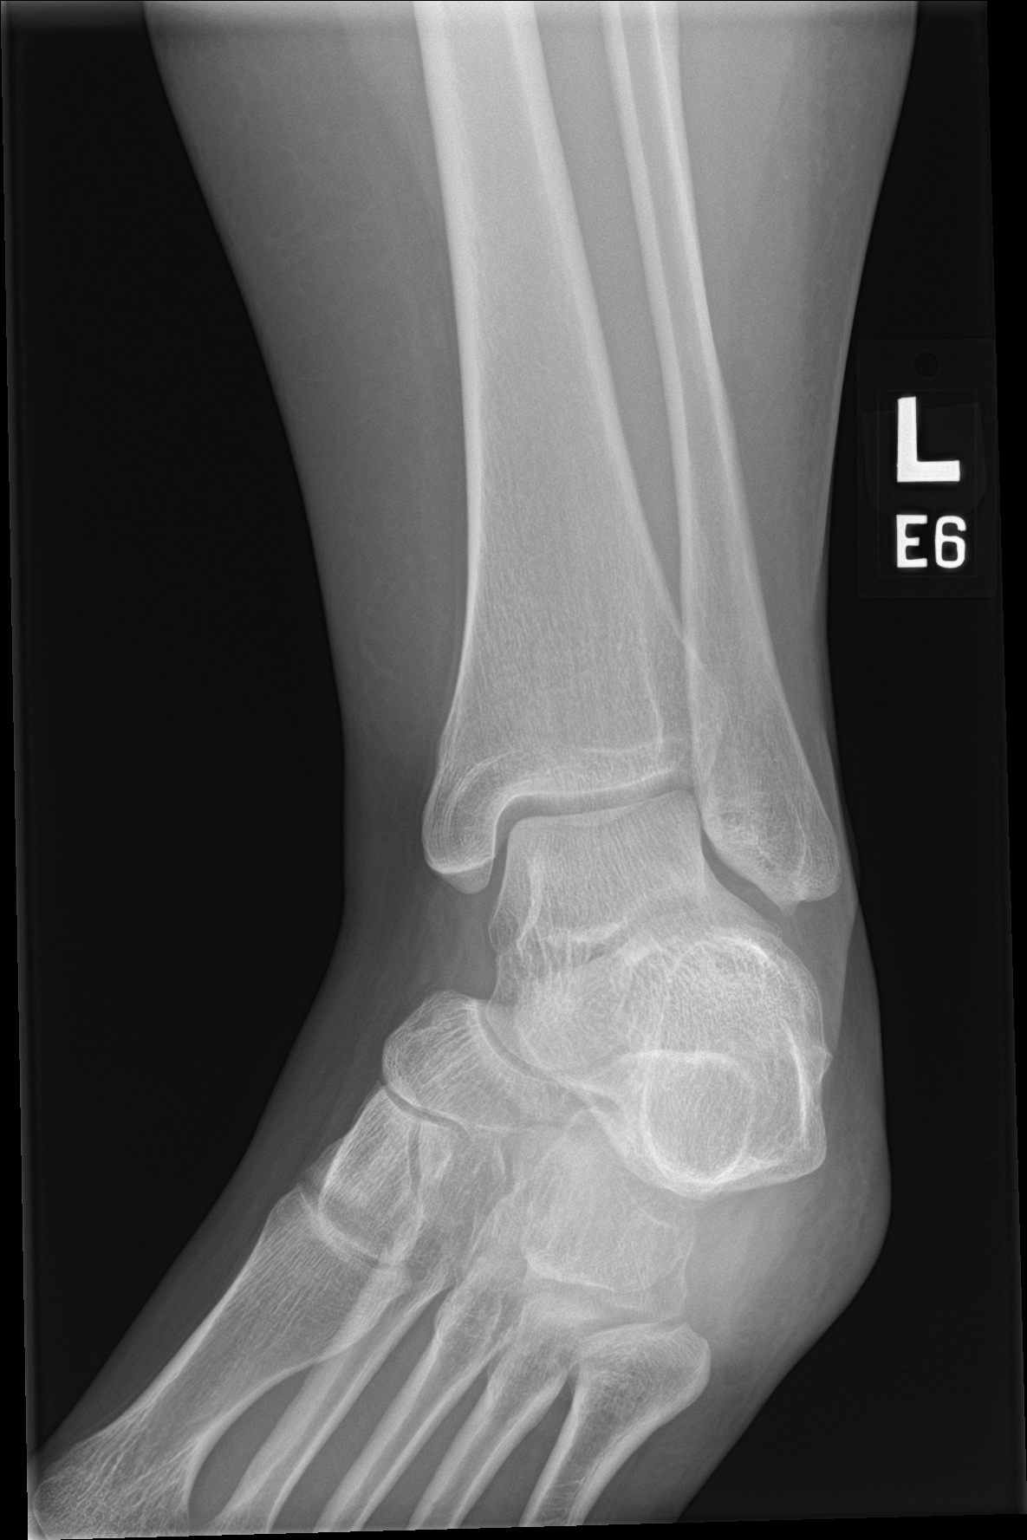

[3 of 3 positions shown; findings below may reference images not displayed]

FINDINGS: There is no evidence of fracture, dislocation, or joint effusion.
There is no evidence of arthropathy or other focal bone abnormality.
Soft tissues are unremarkable. There is a small plantar calcaneal
spur. There is a tiny Achilles tendon enthesophyte.
IMPRESSION: 1. Small plantar calcaneal spur.
2. Tiny Achilles tendon enthesophyte.
3. No acute osseous abnormality.

## 2021-05-01 ENCOUNTER — Other Ambulatory Visit: Payer: Self-pay | Admitting: Neurology

## 2021-05-06 ENCOUNTER — Telehealth: Payer: Self-pay | Admitting: Neurology

## 2021-05-06 ENCOUNTER — Other Ambulatory Visit: Payer: Self-pay

## 2021-05-06 MED ORDER — NORTRIPTYLINE HCL 25 MG PO CAPS: 75.0000 mg | ORAL_CAPSULE | Freq: Every day | ORAL | 5 refills | Status: DC

## 2021-05-06 NOTE — Telephone Encounter (Signed)
Patient called in stating she needs a refill on her nortriptyline to get her to her next visit on 11/18/21

## 2021-05-06 NOTE — Telephone Encounter (Signed)
Refill sent in for pt to pharmacy on file

## 2021-05-20 ENCOUNTER — Encounter: Payer: Self-pay | Admitting: Adult Health

## 2021-05-20 ENCOUNTER — Other Ambulatory Visit (HOSPITAL_COMMUNITY)
Admission: RE | Admit: 2021-05-20 | Discharge: 2021-05-20 | Disposition: A | Discharge status: Home / Self Care | Payer: BC Managed Care – PPO | Source: Ambulatory Visit | Attending: Adult Health | Admitting: Adult Health

## 2021-05-20 ENCOUNTER — Other Ambulatory Visit: Payer: Self-pay

## 2021-05-20 ENCOUNTER — Ambulatory Visit (INDEPENDENT_AMBULATORY_CARE_PROVIDER_SITE_OTHER): Payer: BC Managed Care – PPO | Admitting: Adult Health

## 2021-05-20 VITALS — BP 145/98 | HR 91 | Ht 66.0 in | Wt 238.2 lb

## 2021-05-20 DIAGNOSIS — Z01419 Encounter for gynecological examination (general) (routine) without abnormal findings: Secondary | ICD-10-CM | POA: Insufficient documentation

## 2021-05-20 DIAGNOSIS — R030 Elevated blood-pressure reading, without diagnosis of hypertension: Secondary | ICD-10-CM | POA: Diagnosis not present

## 2021-05-20 NOTE — Progress Notes (Signed)
  Subjective:     Patient ID: Elizabeth Frank, female   DOB: 07/16/1991, 30 y.o.   MRN: [id number]  HPI Elizabeth is a 30 year old white female,single, G0P0, in for a pelvic and pap, had physical with PCP. PCP is [facility].  Review of Systems Patient denies any headaches, hearing loss, fatigue, blurred vision, shortness of breath, chest pain, abdominal pain, problems with bowel movements, urination, or intercourse.(not active) No joint pain or mood swings. Reviewed past medical,surgical, social and family history. Reviewed medications and allergies.     Objective:   Physical Exam BP (!) 145/98 (BP Location: Right Arm, Patient Position: Sitting, Cuff Size: Large)   Pulse 91   Ht 5\' 6"  (1.676 m)   Wt 238 lb 3.2 oz (108 kg)   LMP 05/14/2021 (Exact Date)   BMI 38.45 kg/m  Skin warm and dry.Pelvic: external genitalia is normal in appearance no lesions, vagina: pink with good moisture,urethra has no lesions or masses noted, cervix:evrted at os and friable with EC brush, pap with GC/CHL and HR HPV genotyping performed, uterus: normal size, shape and contour, non tender, no masses felt, adnexa: no masses or tenderness noted. Bladder is non tender and no masses felt.  AA is 3 Fall risk is low Depression screen PHQ 2/9 05/20/2021  Decreased Interest 3  Down, Depressed, Hopeless 3  PHQ - 2 Score 6  Altered sleeping 2  Tired, decreased energy 2  Change in appetite 3  Feeling bad or failure about yourself  2  Trouble concentrating 2  Moving slowly or fidgety/restless 2  Suicidal thoughts 2  PHQ-9 Score 21   GAD 7 : Generalized Anxiety Score 05/20/2021  Nervous, Anxious, on Edge 3  Control/stop worrying 3  Worry too much - different things 3  Trouble relaxing 3  Restless 2  Easily annoyed or irritable 2  Afraid - awful might happen 0  Total GAD 7 Score 16   She is on meds and sees [facility] in [place] and sees Dr Waterford  [facility] - 05/20/21 1611      Pregnancy  Intention Screening   Does the patient want to become pregnant in the next year? No    Does the patient's partner want to become pregnant in the next year? No    Would the patient like to discuss contraceptive options today? No      Contraception Wrap Up   Current Method Oral Contraceptive    End Method Oral Contraceptive    Contraception Counseling Provided No         She gets OCs on online Examination chaperoned by 05/22/21.    Assessment:     1. Encounter for gynecological examination with Papanicolaou smear of cervix Pap sent Pap in 3 years if normal Physical and labs with PCP  2. Elevated BP without diagnosis of hypertension Follow up with PCP    Plan:     Pap in 3 years if normal

## 2021-05-23 LAB — CYTOLOGY - PAP
Chlamydia: NEGATIVE
Comment: NEGATIVE
Comment: NEGATIVE
Comment: NORMAL
Diagnosis: NEGATIVE
High risk HPV: NEGATIVE
Neisseria Gonorrhea: NEGATIVE

## 2021-11-17 NOTE — Progress Notes (Signed)
NEUROLOGY FOLLOW UP OFFICE NOTE  Elizabeth Frank [id number]  Assessment/Plan:   Migraine without aura, without status migrainosus, not intractable 2.  Tension-type headache, not intractable 3.   Stuttering, paraphasic errors, short term memory problems - will evaluate for secondary etiologies - if unremarkable, likely related to anxiety.  Headache prevention:  nortriptyline 75mg  at bedtime Check MRI of brain with and without contrast Check B12 and TSH Limit use of pain relievers to no more than 2 days out of week to prevent risk of rebound or medication-overuse headache. Keep headache diary Follow up one year   Subjective:  Elizabeth Frank is a 30 year old right-handed female who follows up for migraines.   UPDATE: Intensity:  moderate Duration:  30 to 60 minutes Frequency:  3 or 5 headaches a month  She reports that she has been stuttering for 1 to 2 years.  She has no prior history of stuttering.  She also makes paraphasic errors.  For example, she may say "grab the door" instead of "grab the plate".  When she is having a conversation, she may quickly forget what the other person just told her.     Current NSAIDs:  Ibuprofen Current analgesic:  none Current antidepressant:  Nortriptyline 75mg , fluoxetine 10mg  Current antidepressant:  gabapentin  Current antihistamines/decongestants:  Claritin, Flonase Birth control:  Tri-Estarylla   HISTORY: She has longstanding history of neck and back pain.  When she gets the neck pain, she develops a headache radiating from the back of her head up to the front, bilaterally.  It is usually an ache but sometimes throbbing.  When severe, there may be photophobia or phonophobia but no nausea or visual disturbance.  It is about 6-7/10 intensity.  Initially, it lasts a couple of hours and occurs 4 to 5 times a week.  Headaches had subsequently been well-controlled on nortriptyline.  She self-discontinued nortriptyline in 2019 since the headaches  had resolved.  In September 2019, she had increased emotional stress after she broke up with her boyfriend.  Since then, she reports increased frequency of headaches, including migraines.  Migraines became 8-9/10 intensity and associated with blurred vision, photophobia, phonophobia and sometimes nausea (no vomiting or unilateral numbness or weakness).  They would typically last all day.  Her typical headaches are 6-7/10, last 2 hours if treated but may last up to all day.  She has had 25 headache days in past 30 days, about 4 were what she calls "migraines".  She has been treating headaches with ibuprofen or Goody powder about 3 to 4 days a week.  She has had increased neck and back pain as well.     Past analgesics:  Goody powder Past triptan:  Rizatriptan (side effects) Past muscle relaxant: cyclobenzaprine, Robaxin    X-rays of the cervical and lumbar spines from 02/21/16 were normal.  PAST MEDICAL HISTORY: Past Medical History:  Diagnosis Date   Frequent headaches    Mental disorder     MEDICATIONS: Current Outpatient Medications on File Prior to Visit  Medication Sig Dispense Refill   busPIRone (BUSPAR) 15 MG tablet Take 15 mg by mouth 2 (two) times daily.     ESTARYLLA 0.25-35 MG-MCG tablet Take by mouth.     FLUoxetine (PROZAC) 10 MG capsule Take by mouth.     fluticasone (FLONASE) 50 MCG/ACT nasal spray SMARTSIG:1-2 Spray(s) Both Nares Daily     hydrOXYzine (ATARAX/VISTARIL) 25 MG tablet Take 25 mg by mouth every 8 (eight) hours as needed.  ibuprofen (ADVIL) 800 MG tablet Take 1 tablet (800 mg total) by mouth 3 (three) times daily. 30 tablet 0   loratadine (CLARITIN) 10 MG tablet Take 10 mg by mouth daily.     methocarbamol (ROBAXIN) 500 MG tablet Take 1 tablet (500 mg total) by mouth 3 (three) times daily. 21 tablet 0   montelukast (SINGULAIR) 10 MG tablet Take 1 tablet by mouth daily.     nortriptyline (PAMELOR) 25 MG capsule Take 3 capsules (75 mg total) by mouth at bedtime.  90 capsule 5   pantoprazole (PROTONIX) 40 MG tablet Take 1 tablet by mouth daily.     No current facility-administered medications on file prior to visit.    ALLERGIES: No Known Allergies  FAMILY HISTORY: Family History  Problem Relation Age of Onset   Hypertension Father    Obesity Sister       Objective:  Blood pressure (!) 137/97, pulse 84, height 5\' 6"  (1.676 m), weight 243 lb (110.2 kg), SpO2 100 %. General: No acute distress.  Patient appears well-groomed.   Head:  Normocephalic/atraumatic Eyes:  Fundi examined but not visualized Neck: supple, no paraspinal tenderness, full range of motion Heart:  Regular rate and rhythm Lungs:  Clear to auscultation bilaterally Back: No paraspinal tenderness Neurological Exam: alert and oriented to person, place, and time.  Speech fluent and not dysarthric, language intact.  CN II-XII intact. Bulk and tone normal, muscle strength 5/5 throughout.  Sensation to light touch intact.  Deep tendon reflexes 2+ throughout, toes downgoing.  Finger to nose testing intact.  Gait normal, Romberg negative.   Metta Clines, DO  CC: Denyce Robert, FNP

## 2021-11-18 ENCOUNTER — Other Ambulatory Visit (INDEPENDENT_AMBULATORY_CARE_PROVIDER_SITE_OTHER): Payer: BC Managed Care – PPO

## 2021-11-18 ENCOUNTER — Encounter: Payer: Self-pay | Admitting: Neurology

## 2021-11-18 ENCOUNTER — Other Ambulatory Visit: Payer: Self-pay

## 2021-11-18 ENCOUNTER — Ambulatory Visit: Payer: BC Managed Care – PPO | Admitting: Neurology

## 2021-11-18 VITALS — BP 137/97 | HR 84 | Ht 66.0 in | Wt 243.0 lb

## 2021-11-18 DIAGNOSIS — R413 Other amnesia: Secondary | ICD-10-CM

## 2021-11-18 DIAGNOSIS — R4702 Dysphasia: Secondary | ICD-10-CM | POA: Diagnosis not present

## 2021-11-18 DIAGNOSIS — F8081 Childhood onset fluency disorder: Secondary | ICD-10-CM

## 2021-11-18 DIAGNOSIS — G43009 Migraine without aura, not intractable, without status migrainosus: Secondary | ICD-10-CM

## 2021-11-18 DIAGNOSIS — G35 Multiple sclerosis: Secondary | ICD-10-CM

## 2021-11-18 DIAGNOSIS — G44219 Episodic tension-type headache, not intractable: Secondary | ICD-10-CM

## 2021-11-18 NOTE — Addendum Note (Signed)
Addended by: Karl Luke A on: 11/18/2021 04:21 PM   Modules accepted: Orders

## 2021-11-18 NOTE — Patient Instructions (Addendum)
Continue nortriptyline Check MRI of brain with and without contrast Check B12 and TSH If MRI unremarkable, follow up one year

## 2021-11-19 LAB — TSH: TSH: 2.78 u[IU]/mL (ref 0.35–5.50)

## 2021-11-19 LAB — VITAMIN B12: Vitamin B-12: 223 pg/mL — ABNORMAL LOW (ref 232–1245)

## 2021-12-15 ENCOUNTER — Inpatient Hospital Stay: Admission: RE | Admit: 2021-12-15 | Payer: BC Managed Care – PPO | Source: Ambulatory Visit

## 2021-12-16 ENCOUNTER — Other Ambulatory Visit: Payer: Self-pay | Admitting: Neurology

## 2021-12-16 DIAGNOSIS — F8081 Childhood onset fluency disorder: Secondary | ICD-10-CM

## 2021-12-16 DIAGNOSIS — R413 Other amnesia: Secondary | ICD-10-CM

## 2021-12-29 ENCOUNTER — Other Ambulatory Visit: Payer: Self-pay | Admitting: Neurology

## 2022-01-01 ENCOUNTER — Ambulatory Visit
Admission: RE | Admit: 2022-01-01 | Discharge: 2022-01-01 | Disposition: A | Discharge status: Home / Self Care | Payer: BC Managed Care – PPO | Source: Ambulatory Visit | Attending: Neurology | Admitting: Neurology

## 2022-01-01 ENCOUNTER — Other Ambulatory Visit: Payer: Self-pay

## 2022-01-01 DIAGNOSIS — F8081 Childhood onset fluency disorder: Secondary | ICD-10-CM

## 2022-01-01 DIAGNOSIS — R413 Other amnesia: Secondary | ICD-10-CM

## 2022-01-01 IMAGING — MR MR HEAD WO/W CM
13 series · 48 of 48 positions shown · IV contrast (20 ml multihance)
Comparison: No pertinent prior exams available for comparison.

CLINICAL DATA: Provided history: Stuttering. Memory deficits.
Memory loss. Additional history provided by scanning technologist:
Patient reports memory and speech difficulty, "switching words,"
stuttering. Symptoms for 2 years.

EXAM:
MRI HEAD WITHOUT AND WITH CONTRAST
TECHNIQUE: Multiplanar, multiecho pulse sequences of the brain and surrounding
structures were obtained without and with intravenous contrast.
CONTRAST:  20mL MULTIHANCE GADOBENATE DIMEGLUMINE 529 MG/ML IV SOLN

[Series 5: T1 · sagittal · 4.0mm · 0.75mm/px · 2 of 31 slices shown (1 of 3)]
[im 1/31]
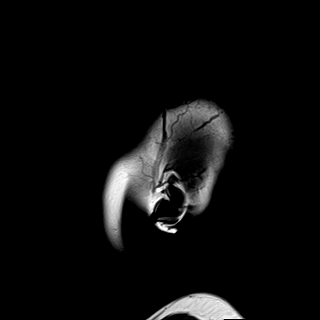
[im 31/31]
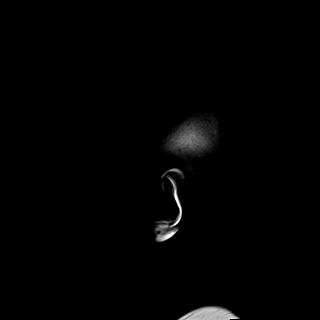

[Series 6: DWI · axial · 3.0mm · 0.94mm/px · z∈[-62,+78]mm · 8 of 160 slices shown (1 of 3)]
[im 1/160]
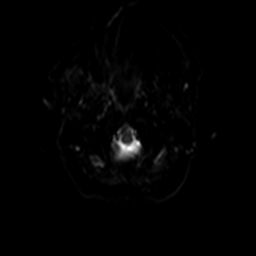
[im 23/160]
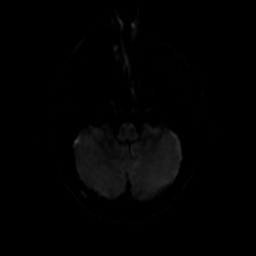
[im 46/160]
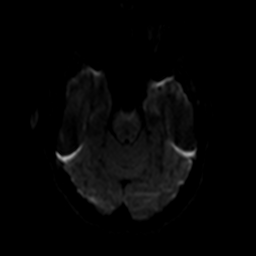
[im 69/160]
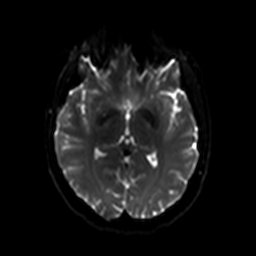
[im 91/160]
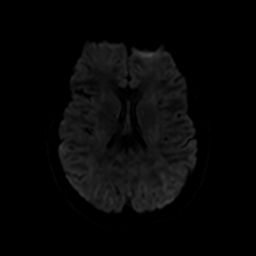
[im 114/160]
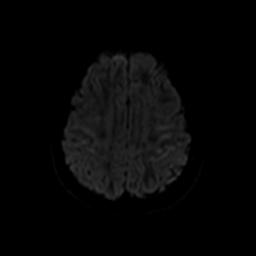
[im 137/160]
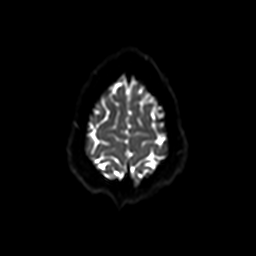
[im 160/160]
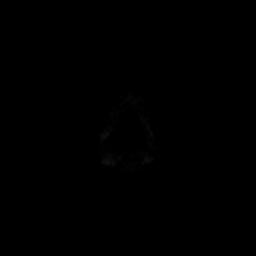

[Series 7: ax dwi_tracew · axial · 3.0mm · 0.94mm/px · z∈[-62,+78]mm · 4 of 80 slices shown]
[im 1/80]
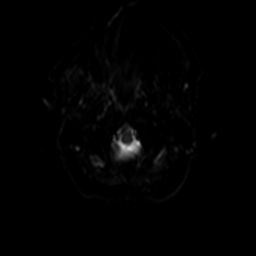
[im 27/80]
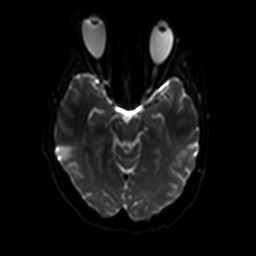
[im 53/80]
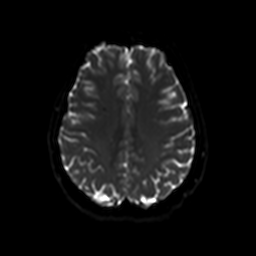
[im 80/80]
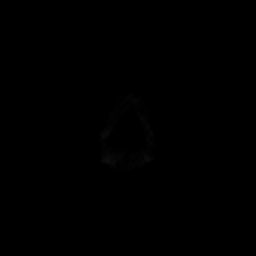

[Series 8: ax dwi_adc · axial · 3.0mm · 0.94mm/px · z∈[-62,+78]mm · 2 of 40 slices shown]
[im 1/40]
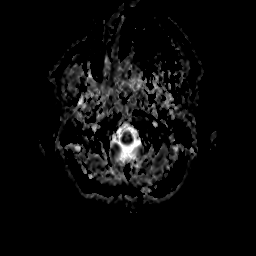
[im 40/40]
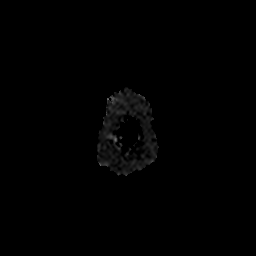

[Series 9: DWI · coronal · 5.0mm · 1.44mm/px · 3 of 60 slices shown (2 of 3)]
[im 1/60]
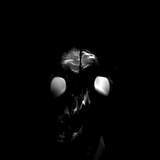
[im 30/60]
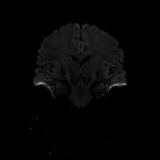
[im 60/60]
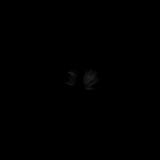

[Series 10: DWI · coronal · 5.0mm · 1.44mm/px · 2 of 30 slices shown (3 of 3)]
[im 1/30]
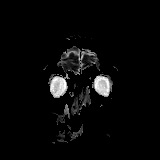
[im 30/30]
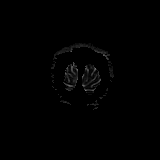

[Series 11: T2 · axial · 4.0mm · 0.36mm/px · 1 of 29 slices shown]
[im 1/29]
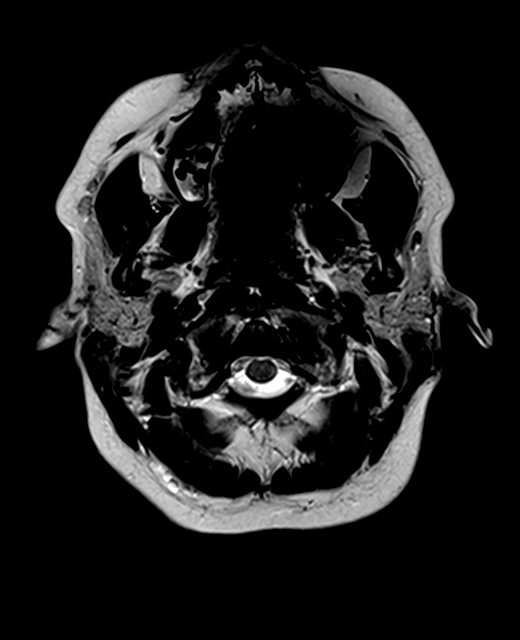

[Series 12: FLAIR · axial · 3.0mm · 0.72mm/px · 1 of 26 slices shown]
[im 1/26]
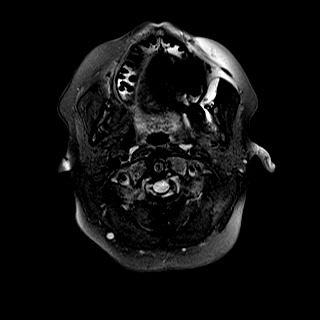

[Series 13: swi_images · axial · 1.5mm · 0.90mm/px · z∈[-57,+85]mm · 5 of 96 slices shown]
[im 1/96]
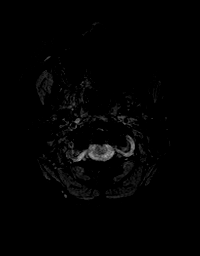
[im 24/96]
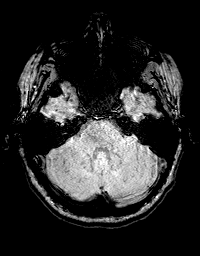
[im 48/96]
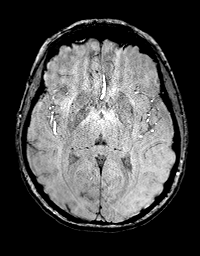
[im 72/96]
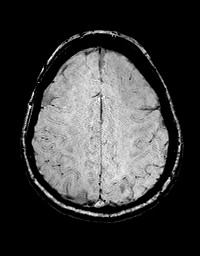
[im 96/96]
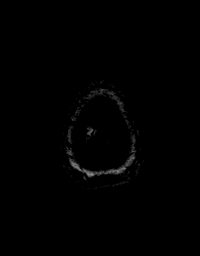

[Series 15: T1 · axial · 1.0mm · 0.94mm/px · z∈[-65,+94]mm · 8 of 160 slices shown (2 of 3)]
[im 1/160]
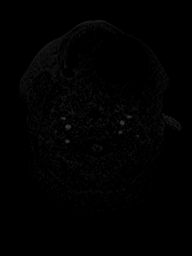
[im 23/160]
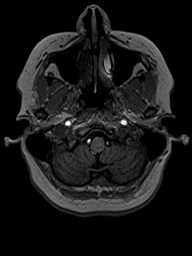
[im 46/160]
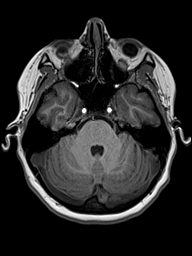
[im 69/160]
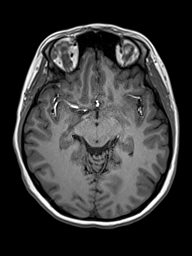
[im 91/160]
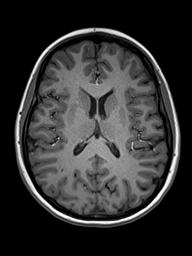
[im 114/160]
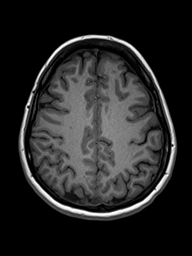
[im 137/160]
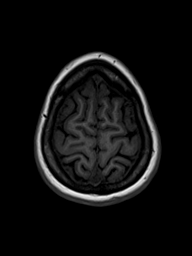
[im 160/160]
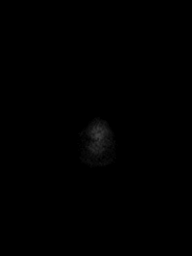

[Series 16: T2 post-contrast · coronal · 4.0mm · 0.36mm/px · 2 of 34 slices shown]
[im 1/34]
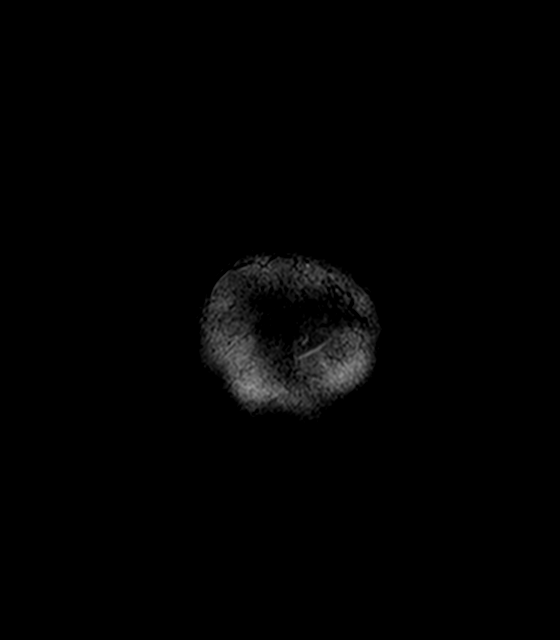
[im 34/34]
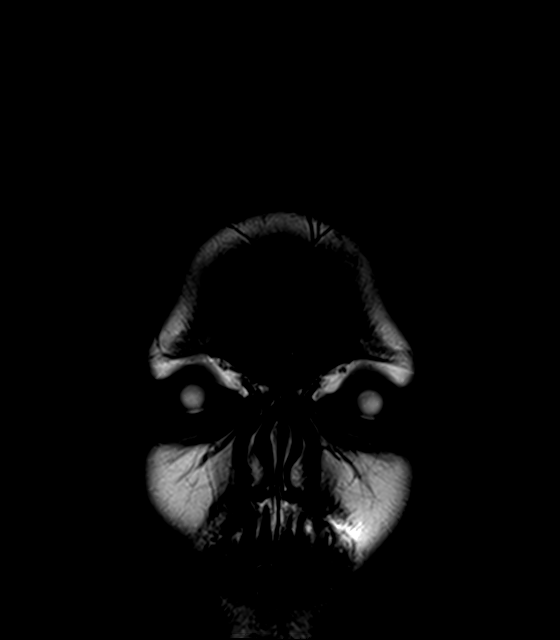

[Series 17: T1 · axial · 1.0mm · 0.94mm/px · z∈[-65,+94]mm · 8 of 160 slices shown (3 of 3)]
[im 1/160]
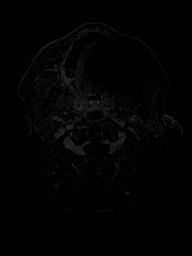
[im 23/160]
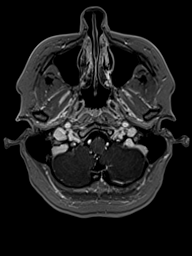
[im 46/160]
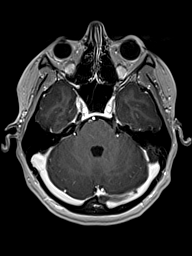
[im 69/160]
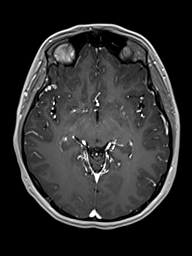
[im 91/160]
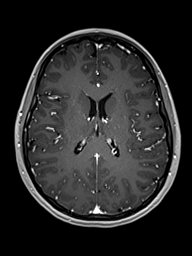
[im 114/160]
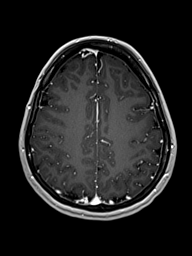
[im 137/160]
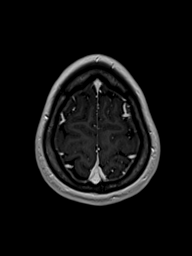
[im 160/160]
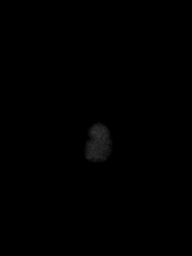

[Series 18: T1 post-contrast · coronal · 4.0mm · 0.72mm/px · 2 of 34 slices shown]
[im 1/34]
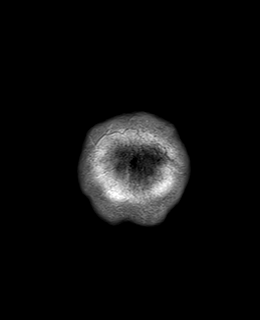
[im 34/34]
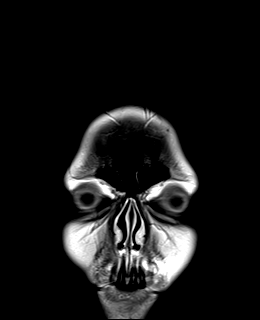

[48 of 48 positions shown; findings below may reference images not displayed]

FINDINGS: Brain:

Cerebral volume is normal.

No cortical encephalomalacia is identified.

There are several (less than 10) small foci of T2 FLAIR hyperintense
signal abnormality scattered within the bilateral frontal lobe white
matter, nonspecific. These foci measure up to 6 mm (for instance as
seen within the anterolateral right frontal lobe on series 12,
images 14 and 15).

4 mm pineal cyst.

There is no acute infarct.

No evidence of an intracranial mass.

No chronic intracranial blood products.

No extra-axial fluid collection.

No midline shift.

No pathologic intracranial enhancement identified.

Vascular: Maintained flow voids within the proximal large arterial
vessels. Right frontal lobe developmental venous anomaly (anatomic
variant).

Skull and upper cervical spine: No focal suspicious marrow lesion.

Sinuses/Orbits: Visualized orbits show no acute finding. Mild
mucosal thickening within the inferior right maxillary sinus.
IMPRESSION: No evidence of acute intracranial abnormality.

Several (less than 10) small nonspecific T2 FLAIR hyperintense
remote insults within the bilateral frontal lobe white matter. These
foci measure up to 6 mm. Differential considerations include chronic
small vessel ischemic disease, sequela of chronic migraine
headaches, sequela of a prior infectious/inflammatory process or
less likely (given the distribution) sequela of a demyelinating
process (such as multiple sclerosis), among others.

Minimal mucosal thickening within the inferior right maxillary
sinus.

## 2022-01-01 MED ORDER — GADOBENATE DIMEGLUMINE 529 MG/ML IV SOLN
20.0000 mL | Freq: Once | INTRAVENOUS | Status: AC | PRN
  Administered 2022-01-01: 20 mL via INTRAVENOUS

## 2022-01-02 ENCOUNTER — Telehealth: Payer: Self-pay | Admitting: Neurology

## 2022-01-02 NOTE — Telephone Encounter (Signed)
Patient called and said she saw her MRI results in MyChart and she is concerned there is an abnormality.  She'd like a call back as soon as the doctor has reviewed the results. She is aware this may have not been done yet.  Patient seemed very concerned.

## 2022-01-02 NOTE — Progress Notes (Signed)
Pt advised of her MRI results.

## 2022-01-16 ENCOUNTER — Telehealth: Payer: Self-pay | Admitting: Neurology

## 2022-01-16 NOTE — Telephone Encounter (Signed)
Patient called stating she has fallen twice this week, he left foot gave out and she hit her head.

## 2022-01-16 NOTE — Telephone Encounter (Signed)
Telephone call to pt, Per pt she has a headache after fallen earlier today for the second time today.   Advised pt if the headache or pain gets worse please go to the ED.

## 2022-01-18 ENCOUNTER — Other Ambulatory Visit: Payer: Self-pay

## 2022-01-18 ENCOUNTER — Ambulatory Visit
Admission: EM | Admit: 2022-01-18 | Discharge: 2022-01-18 | Disposition: A | Discharge status: Home / Self Care | Payer: BC Managed Care – PPO | Attending: Student | Admitting: Student

## 2022-01-18 ENCOUNTER — Encounter: Payer: Self-pay | Admitting: *Deleted

## 2022-01-18 ENCOUNTER — Ambulatory Visit (INDEPENDENT_AMBULATORY_CARE_PROVIDER_SITE_OTHER): Payer: BC Managed Care – PPO

## 2022-01-18 DIAGNOSIS — F909 Attention-deficit hyperactivity disorder, unspecified type: Secondary | ICD-10-CM | POA: Insufficient documentation

## 2022-01-18 DIAGNOSIS — R052 Subacute cough: Secondary | ICD-10-CM | POA: Diagnosis not present

## 2022-01-18 DIAGNOSIS — R102 Pelvic and perineal pain: Secondary | ICD-10-CM | POA: Insufficient documentation

## 2022-01-18 DIAGNOSIS — N760 Acute vaginitis: Secondary | ICD-10-CM | POA: Diagnosis present

## 2022-01-18 DIAGNOSIS — J209 Acute bronchitis, unspecified: Secondary | ICD-10-CM | POA: Insufficient documentation

## 2022-01-18 DIAGNOSIS — R059 Cough, unspecified: Secondary | ICD-10-CM | POA: Diagnosis not present

## 2022-01-18 HISTORY — DX: Anxiety disorder, unspecified: F41.9

## 2022-01-18 HISTORY — DX: Attention-deficit hyperactivity disorder, unspecified type: F90.9

## 2022-01-18 IMAGING — DX DG CHEST 2V
2 series · 2 of 2 positions shown · non-contrast
Comparison: [DATE]

CLINICAL DATA: Reports having + strep and sinus infection (had neg
flu & covid) approx 2-3 wks ago. States continues with cough since
then - was sent in [facility] for albuterol HFA & was told to take Mucinex
Severe Cough & Congestion.

EXAM:
CHEST - 2 VIEW

[chest pa]
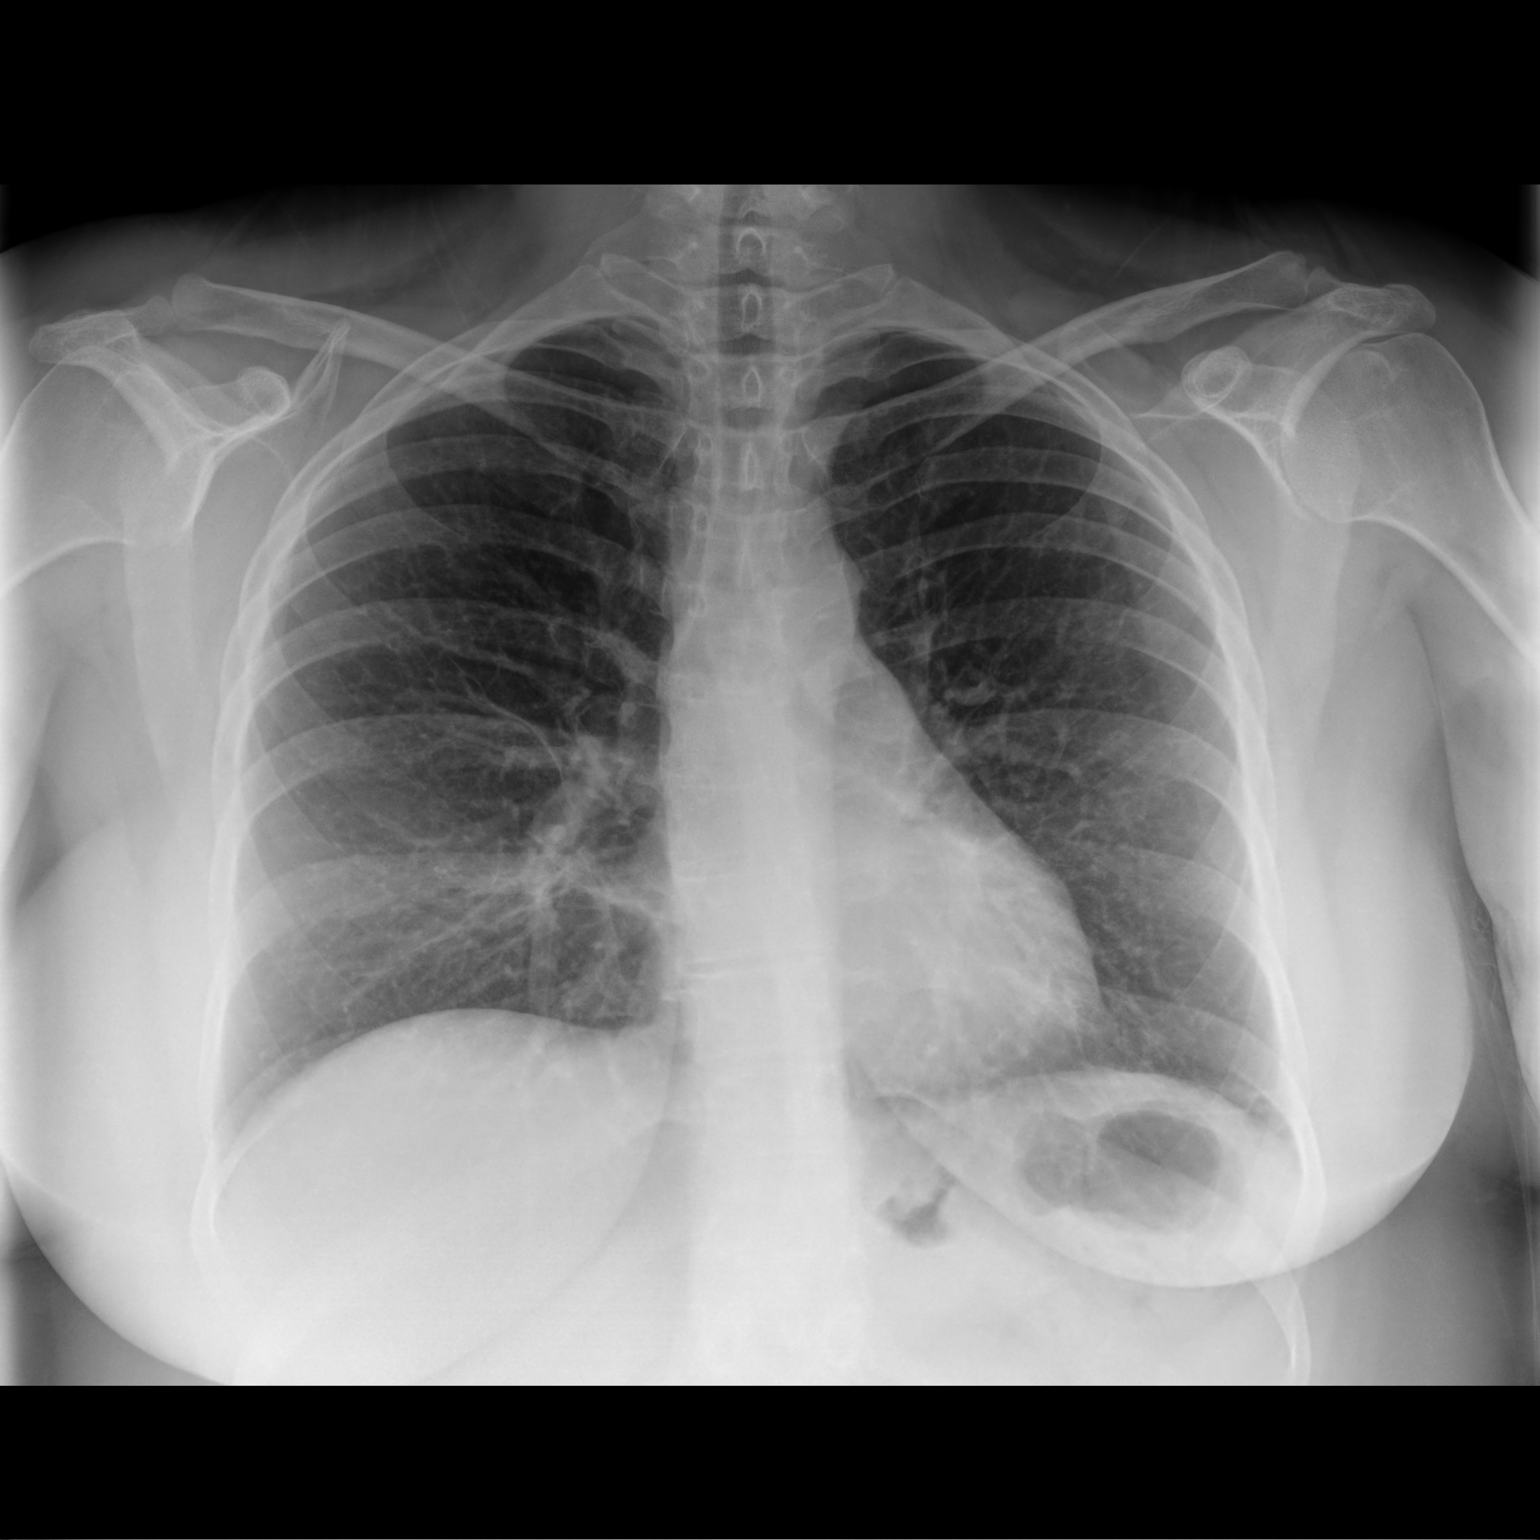

[chest lat]
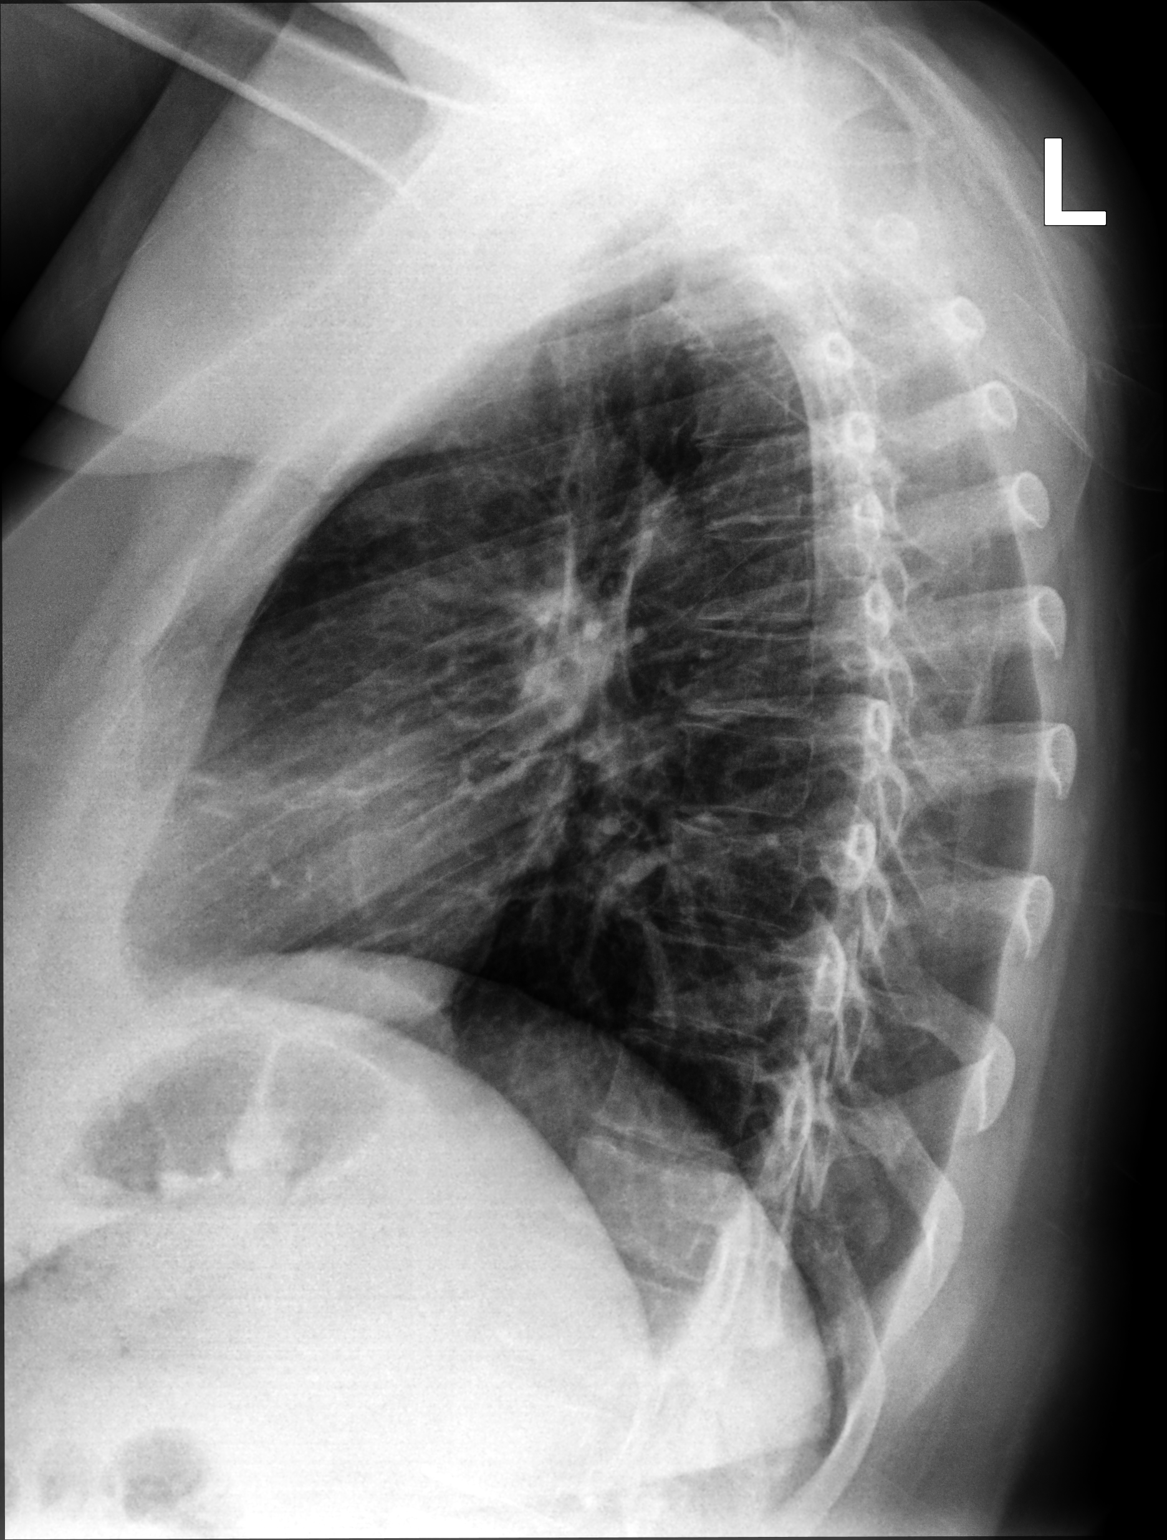

[2 of 2 positions shown; findings below may reference images not displayed]

FINDINGS: Cardiac silhouette is normal in size and configuration. Normal
mediastinal and hilar contours. Mild linear opacity in the right
middle lobe, and lateral base of the left upper lobe lingula,
consistent with atelectasis. Remainder of the lungs is clear. No
pleural effusion or pneumothorax.

Skeletal structures are unremarkable.
IMPRESSION: No active cardiopulmonary disease.

## 2022-01-18 MED ORDER — BENZONATATE 100 MG PO CAPS: 100.0000 mg | ORAL_CAPSULE | Freq: Three times a day (TID) | ORAL | 0 refills | Status: DC

## 2022-01-18 MED ORDER — PREDNISONE 10 MG (21) PO TBPK: ORAL_TABLET | Freq: Every day | ORAL | 0 refills | Status: DC

## 2022-01-18 MED ORDER — FLUCONAZOLE 150 MG PO TABS: 150.0000 mg | ORAL_TABLET | Freq: Every day | ORAL | 0 refills | Status: DC

## 2022-01-18 NOTE — ED Provider Notes (Signed)
[facility]    CSN: [id number] Arrival date & time: 01/18/22  1119      History   Chief Complaint Chief Complaint  Patient presents with   Cough   Vaginal Discharge    HPI Elizabeth Frank is a 31 y.o. female presenting with cough and vaginal discharge.  Medical history recent strep and sinus infection about 3 weeks ago. No formal diagnosis asthma, patient suspects she may have this. -Was evaluated at a different urgent care about 3 weeks ago 01/05/22, patient states that she was strep positive and diagnosed with a sinus infection (negative flu and negative COVID).  Was prescribed steroid taper, albuterol and penicillin; completed the abx 3 days ago. Cough is nonproductive. DOE. Denies SOB at rest, CP, dizziness, weakness, fevers/chlls. -Also with external vaginal irritation and vaginal discharge x3 days. Discharge is white and creamy, thick. Has tried OTC monistat, which has been helping somewhat. States the skin feels that it's ripped. Has been vigorously itching her genital area. Same female partner, hasn't had unprotected intercourse since then. History HPV but never HSV per pt. Denies vaginal trauma or rough intercourse.  HPI  Past Medical History:  Diagnosis Date   ADHD    Anxiety    Frequent headaches    Mental disorder     Patient Active Problem List   Diagnosis Date Noted   Encounter for gynecological examination with Papanicolaou smear of cervix 05/20/2021   Elevated BP without diagnosis of hypertension 05/20/2021   History of abnormal Pap smear 04/17/2013    Past Surgical History:  Procedure Laterality Date   two teeth removed      OB History     Gravida  0   Para  0   Term  0   Preterm  0   AB  0   Living  0      SAB  0   IAB  0   Ectopic  0   Multiple  0   Live Births  0            Home Medications    Prior to Admission medications   Medication Sig Start Date End Date Taking? Authorizing Provider  benzonatate  (TESSALON) 100 MG capsule Take 1 capsule (100 mg total) by mouth every 8 (eight) hours. 01/18/22  Yes Hazel Sams, PA-C  ESTARYLLA 0.25-35 MG-MCG tablet Take by mouth. 04/05/21  Yes [provider]  fluconazole (DIFLUCAN) 150 MG tablet Take 1 tablet (150 mg total) by mouth daily. -For your yeast infection, start the Diflucan (fluconazole)- Take one pill today (day 1). If you're still having symptoms in 3 days, take the second pill. 01/18/22  Yes Hazel Sams, PA-C  FLUoxetine (PROZAC) 10 MG capsule Take by mouth. 05/02/21  Yes [provider]  fluticasone (FLONASE) 50 MCG/ACT nasal spray SMARTSIG:1-2 Spray(s) Both Nares Daily 05/01/21  Yes [provider]  gabapentin (NEURONTIN) 300 MG capsule Take 300 mg by mouth 3 (three) times daily.   Yes [provider]  Lisdexamfetamine Dimesylate (VYVANSE PO) Take by mouth.   Yes [provider]  loratadine (CLARITIN) 10 MG tablet Take 10 mg by mouth daily. 05/01/21  Yes [provider]  montelukast (SINGULAIR) 10 MG tablet Take 1 tablet by mouth daily. 02/26/21  Yes [provider]  nortriptyline (PAMELOR) 25 MG capsule TAKE 3 CAPSULES BY MOUTH AT BEDTIME 12/29/21  Yes Jaffe, Adam R, DO  pantoprazole (PROTONIX) 40 MG tablet Take 1 tablet by mouth  daily. 05/13/21  Yes [provider]  predniSONE (STERAPRED UNI-PAK 21 TAB) 10 MG (21) TBPK tablet Take by mouth daily. Take 6 tabs by mouth daily  for 2 days, then 5 tabs for 2 days, then 4 tabs for 2 days, then 3 tabs for 2 days, 2 tabs for 2 days, then 1 tab by mouth daily for 2 days 01/18/22  Yes Phillip Heal, Sherlon Handing, PA-C  ibuprofen (ADVIL) 800 MG tablet Take 1 tablet (800 mg total) by mouth 3 (three) times daily. 10/03/20   Avegno, Darrelyn Hillock, FNP    Family History Family History  Problem Relation Age of Onset   Healthy Mother    Hypertension Father    Obesity Sister     Social History Social History   Tobacco Use   Smoking status: Never    Smokeless tobacco: Never  Vaping Use   Vaping Use: Never used  Substance Use Topics   Alcohol use: Yes    Comment: occasionally   Drug use: Yes    Types: Marijuana     Allergies   Patient has no known allergies.   Review of Systems Review of Systems  Constitutional:  Negative for appetite change, chills and fever.  HENT:  Negative for congestion, ear pain, rhinorrhea, sinus pressure, sinus pain and sore throat.   Eyes:  Negative for redness and visual disturbance.  Respiratory:  Positive for cough. Negative for chest tightness, shortness of breath and wheezing.   Cardiovascular:  Negative for chest pain and palpitations.  Gastrointestinal:  Negative for abdominal pain, constipation, diarrhea, nausea and vomiting.  Genitourinary:  Positive for vaginal discharge. Negative for dysuria, frequency and urgency.  Musculoskeletal:  Negative for myalgias.  Neurological:  Negative for dizziness, weakness and headaches.  Psychiatric/Behavioral:  Negative for confusion.   All other systems reviewed and are negative.   Physical Exam Triage Vital Signs ED Triage Vitals  Enc Vitals Group     BP 01/18/22 1153 (!) 139/91     Pulse Rate 01/18/22 1153 (!) 107     Resp 01/18/22 1153 14     Temp 01/18/22 1153 98.6 F (37 C)     Temp Source 01/18/22 1153 Oral     SpO2 01/18/22 1153 97 %     Weight --      Height --      Head Circumference --      Peak Flow --      Pain Score 01/18/22 1155 0     Pain Loc --      Pain Edu? --      Excl. in [place]? --    No data found.  Updated Vital Signs BP (!) 139/91    Pulse (!) 107    Temp 98.6 F (37 C) (Oral)    Resp 14    LMP 01/11/2022 (Approximate)    SpO2 97%   Visual Acuity Right Eye Distance:   Left Eye Distance:   Bilateral Distance:    Right Eye Near:   Left Eye Near:    Bilateral Near:     Physical Exam Vitals reviewed. Exam conducted with a chaperone present.  Constitutional:      General: She is not in acute distress.     Appearance: Normal appearance. She is not ill-appearing.  HENT:     Head: Normocephalic and atraumatic.     Right Ear: Tympanic membrane, ear canal and external ear normal. No tenderness. No middle ear effusion. There is no impacted cerumen. Tympanic membrane  is not perforated, erythematous, retracted or bulging.     Left Ear: Tympanic membrane, ear canal and external ear normal. No tenderness.  No middle ear effusion. There is no impacted cerumen. Tympanic membrane is not perforated, erythematous, retracted or bulging.     Nose: Nose normal. No congestion.     Mouth/Throat:     Mouth: Mucous membranes are moist.     Pharynx: Uvula midline. No oropharyngeal exudate or posterior oropharyngeal erythema.     Comments: Moist mucous membranes Eyes:     Extraocular Movements: Extraocular movements intact.     Pupils: Pupils are equal, round, and reactive to light.  Cardiovascular:     Rate and Rhythm: Regular rhythm. Tachycardia present.     Heart sounds: Normal heart sounds.  Pulmonary:     Effort: Pulmonary effort is normal.     Breath sounds: Normal breath sounds. No decreased breath sounds, wheezing, rhonchi or rales.  Abdominal:     General: Bowel sounds are normal. There is no distension.     Palpations: Abdomen is soft. There is no mass.     Tenderness: There is no abdominal tenderness. There is no right CVA tenderness, left CVA tenderness, guarding or rebound.  Genitourinary:    Comments: Chaperone: Christine RN There is a small 14mm tear of vaginal mucosa on the clitoral hood. No active bleeding. No external discharge visible. No other rash or lesion. Internal exam not performed. Lymphadenopathy:     Cervical: No cervical adenopathy.     Right cervical: No superficial cervical adenopathy.    Left cervical: No superficial cervical adenopathy.  Skin:    General: Skin is warm.     Capillary Refill: Capillary refill takes less than 2 seconds.     Comments: Good skin turgor   Neurological:     General: No focal deficit present.     Mental Status: She is alert and oriented to person, place, and time.  Psychiatric:        Mood and Affect: Mood normal.        Behavior: Behavior normal.        Thought Content: Thought content normal.        Judgment: Judgment normal.     UC Treatments / Results  Labs (all labs ordered are listed, but only abnormal results are displayed) Labs Reviewed  CERVICOVAGINAL ANCILLARY ONLY    EKG   Radiology DG Chest 2 View  Result Date: 01/18/2022 CLINICAL DATA:  Reports having + strep and sinus infection (had neg flu & covid) approx 2-3 wks ago. States continues with cough since then - was sent in Rx for albuterol HFA & was told to take Mucinex Severe Cough & Congestion. EXAM: CHEST - 2 VIEW COMPARISON:  03/05/2015 FINDINGS: Cardiac silhouette is normal in size and configuration. Normal mediastinal and hilar contours. Mild linear opacity in the right middle lobe, and lateral base of the left upper lobe lingula, consistent with atelectasis. Remainder of the lungs is clear. No pleural effusion or pneumothorax. Skeletal structures are unremarkable. IMPRESSION: No active cardiopulmonary disease. Electronically Signed   By: Lajean Manes M.D.   On: 01/18/2022 14:38    Procedures Procedures (including critical care time)  Medications Ordered in UC Medications - No data to display  Initial Impression / Assessment and Plan / UC Course  I have reviewed the triage vital signs and the nursing notes.  Pertinent labs & imaging results that were available during my care of the patient were reviewed by  me and considered in my medical decision making (see chart for details).     This patient is a very pleasant 31 y.o. year old female presenting with bronchitis and vaginal tear. Borderline tachy but afebrile. LMP 01/11/22, States she is not pregnant or breastfeeding. Attribute tachycardia to vyvanse, which she takes daily. Breath sounds clear  throughout, but patient requesting CXR - negative.   Strep positive 01/05/22, managed appropriately with penicillin wih resolution.  Recent abx; suspect vaginal candida. Denies STI risk. Will send self-swab for G/C, trich, yeast, BV testing. Declines HIV, RPR. Safe sex precautions.   Diflucan sent. Also rec SITZ baths and aquaphor or similar to the clitoral tear.  Prednisone taper and tessalon sent. Continue albuterol.   ED return precautions discussed. Patient verbalizes understanding and agreement.   Coding Level 4 for acute illness with systemic symptoms, review of past notes and labs, and prescription drug management   Final Clinical Impressions(s) / UC Diagnoses   Final diagnoses:  Attention deficit hyperactivity disorder (ADHD), unspecified ADHD type  Vaginitis and vulvovaginitis  Vaginal pain  Acute bronchitis, unspecified organism     Discharge Instructions      -For your yeast infection, start the Diflucan (fluconazole)- Take one pill today (day 1). If you're still having symptoms in 3 days, take the second pill.  -We have sent testing for vaginitis. We will notify you of any positive results once they are received. If required, we will prescribe any medications you might need. Please refrain from all sexual activity until treatment is complete.  -Seek additional medical attention if you develop fevers/chills, new/worsening abdominal pain, new/worsening vaginal discomfort/discharge, etc.  -You have bronchitis. Bronchitis is an inflammation of the lining of your bronchial tubes, which carry air to and from your lungs. This typically occurs after a virus, like a cold virus. People who have bronchitis often cough up thickened mucus, which can be discolored. This isn't a bacterial infection, so you don't need antibiotics. We treat it with medications to help reduce inflammation and open up your lungs.  -Prednisone taper and tessalon perles for cough.      ED Prescriptions      Medication Sig Dispense Auth. Provider   fluconazole (DIFLUCAN) 150 MG tablet Take 1 tablet (150 mg total) by mouth daily. -For your yeast infection, start the Diflucan (fluconazole)- Take one pill today (day 1). If you're still having symptoms in 3 days, take the second pill. 2 tablet Hazel Sams, PA-C   benzonatate (TESSALON) 100 MG capsule Take 1 capsule (100 mg total) by mouth every 8 (eight) hours. 21 capsule Hazel Sams, PA-C   predniSONE (STERAPRED UNI-PAK 21 TAB) 10 MG (21) TBPK tablet Take by mouth daily. Take 6 tabs by mouth daily  for 2 days, then 5 tabs for 2 days, then 4 tabs for 2 days, then 3 tabs for 2 days, 2 tabs for 2 days, then 1 tab by mouth daily for 2 days 42 tablet Hazel Sams, PA-C      PDMP not reviewed this encounter.   Hazel Sams, PA-C 01/18/22 1443

## 2022-01-18 NOTE — ED Triage Notes (Signed)
Reports having + strep and sinus infection (had neg flu & covid) approx 2-3 wks ago. States continues with cough since then - was sent in Rx for albuterol HFA & was told to take Mucinex Severe Cough & Congestion.  C/O sensation of "ripped" painful skin to vulva onset 3 days ago.  C/O slight vaginal discharge.

## 2022-01-18 NOTE — Discharge Instructions (Addendum)
-  For your yeast infection, start the Diflucan (fluconazole)- Take one pill today (day 1). If you're still having symptoms in 3 days, take the second pill.  -We have sent testing for vaginitis. We will notify you of any positive results once they are received. If required, we will prescribe any medications you might need. Please refrain from all sexual activity until treatment is complete.  -Seek additional medical attention if you develop fevers/chills, new/worsening abdominal pain, new/worsening vaginal discomfort/discharge, etc.  -You have bronchitis. Bronchitis is an inflammation of the lining of your bronchial tubes, which carry air to and from your lungs. This typically occurs after a virus, like a cold virus. People who have bronchitis often cough up thickened mucus, which can be discolored. This isn't a bacterial infection, so you don't need antibiotics. We treat it with medications to help reduce inflammation and open up your lungs.  -Prednisone taper and tessalon perles for cough.

## 2022-01-19 LAB — CERVICOVAGINAL ANCILLARY ONLY
Bacterial Vaginitis (gardnerella): NEGATIVE
Candida Glabrata: NEGATIVE
Candida Vaginitis: NEGATIVE
Chlamydia: NEGATIVE
Comment: NEGATIVE
Comment: NEGATIVE
Comment: NEGATIVE
Comment: NEGATIVE
Comment: NEGATIVE
Comment: NORMAL
Neisseria Gonorrhea: NEGATIVE
Trichomonas: NEGATIVE

## 2022-01-19 NOTE — Telephone Encounter (Signed)
Pt Is sch for 01-21-22

## 2022-01-19 NOTE — Telephone Encounter (Signed)
Per pt, Her legs went numb and she fell.  Per pt the left side her arm has some jerking.  Advised the patient to schedule follow up visit to see what is going.

## 2022-01-19 NOTE — Telephone Encounter (Signed)
Patient called in again regarding her fall. Stated she has not heard back from jaffe.

## 2022-01-20 NOTE — Progress Notes (Signed)
NEUROLOGY FOLLOW UP OFFICE NOTE  Martinique M Tsosie [id number]  Assessment/Plan:   Left upper and lower extremity weakness and myoclonus - Brain unremarkable.  Would evaluate for cervical cord lesion. 2.  Stuttering, paraphasic errors, short term memory problems - She did have low B12 level which may be contributing.  3.  Migraine without aura, without status migrainosus, not intractable   MRI of cervical spine with and without contrast.  Further recommendations pending results. Continue B12 1023mcg daily.  Recheck level in 6 months Nortriptyline 75mg  at bedtime for migraines Move up follow up to 6 months.     Subjective:  Martinique Arp is a 31 year old right-handed female who presents for leg jerking.   UPDATE: She reports that she has been stuttering for 1 to 2 years.  She has no prior history of stuttering.  She also makes paraphasic errors.  For example, she may say "grab the door" instead of "grab the plate".  When she is having a conversation, she may quickly forget what the other person just told her.  TSH was normal at 2.78 but B12 was low at 223.  She was advised to start OTC B12 1030mcg daily.  MRI of brain with and without contrast on 01/01/2022 personally reviewed mild scattered punctate T2/FLAIR hyperintense foci within the bilateral frontal lobe white matter without abnormal enhancement.  Still notes some memory problems and stuttering.     A couple months ago, she was putting paper towels away in a cabinet when she slipped forward and hit her forehead on the cabinet door.  Since then, she notes strain in the back of her neck.  She started noticing that her left foot will jerk when her leg is raised, such as when trying to put on a shoe.  She had two falls last week in which her left leg just gave out.  She also has started noticing more mild and infrequent jerks of her left hand as well.  Sometimes she has dropped objects.  Denies numbness or radicular pain in the extremities.   Had some constipation for a time due to antibiotics for bronchitis but otherwise no new bowel or bladder dysfunction.  Left hand sometimes may tremor but could be side effect of prednisone which she is taking for bronchitis.    Current NSAIDs:  Ibuprofen Current analgesic:  none Current antidepressant:  Nortriptyline 75mg , fluoxetine 10mg  Current antidepressant:  gabapentin  Current antihistamines/decongestants:  Claritin, Flonase Birth control:  Tri-Estarylla   HISTORY: She has longstanding history of neck and back pain.  When she gets the neck pain, she develops a headache radiating from the back of her head up to the front, bilaterally.  It is usually an ache but sometimes throbbing.  When severe, there may be photophobia or phonophobia but no nausea or visual disturbance.  It is about 6-7/10 intensity.  Initially, it lasts a couple of hours and occurs 4 to 5 times a week.  Headaches had subsequently been well-controlled on nortriptyline.  She self-discontinued nortriptyline in 2019 since the headaches had resolved.  In September 2019, she had increased emotional stress after she broke up with her boyfriend.  Since then, she reports increased frequency of headaches, including migraines.  Migraines became 8-9/10 intensity and associated with blurred vision, photophobia, phonophobia and sometimes nausea (no vomiting or unilateral numbness or weakness).  They would typically last all day.  Her typical headaches are 6-7/10, last 2 hours if treated but may last up to all day.  She has had 25 headache days in past 30 days, about 4 were what she calls "migraines".  She has been treating headaches with ibuprofen or Goody powder about 3 to 4 days a week.  She has had increased neck and back pain as well.     Past analgesics:  Goody powder Past triptan:  Rizatriptan (side effects) Past muscle relaxant: cyclobenzaprine, Robaxin     X-rays of the cervical and lumbar spines from 02/21/16 were normal.  PAST  MEDICAL HISTORY: Past Medical History:  Diagnosis Date   ADHD    Anxiety    Frequent headaches    Mental disorder     MEDICATIONS: Current Outpatient Medications on File Prior to Visit  Medication Sig Dispense Refill   benzonatate (TESSALON) 100 MG capsule Take 1 capsule (100 mg total) by mouth every 8 (eight) hours. 21 capsule 0   ESTARYLLA 0.25-35 MG-MCG tablet Take by mouth.     fluconazole (DIFLUCAN) 150 MG tablet Take 1 tablet (150 mg total) by mouth daily. -For your yeast infection, start the Diflucan (fluconazole)- Take one pill today (day 1). If you're still having symptoms in 3 days, take the second pill. 2 tablet 0   FLUoxetine (PROZAC) 10 MG capsule Take by mouth.     fluticasone (FLONASE) 50 MCG/ACT nasal spray SMARTSIG:1-2 Spray(s) Both Nares Daily     gabapentin (NEURONTIN) 300 MG capsule Take 300 mg by mouth 3 (three) times daily.     ibuprofen (ADVIL) 800 MG tablet Take 1 tablet (800 mg total) by mouth 3 (three) times daily. 30 tablet 0   Lisdexamfetamine Dimesylate (VYVANSE PO) Take by mouth.     loratadine (CLARITIN) 10 MG tablet Take 10 mg by mouth daily.     montelukast (SINGULAIR) 10 MG tablet Take 1 tablet by mouth daily.     nortriptyline (PAMELOR) 25 MG capsule TAKE 3 CAPSULES BY MOUTH AT BEDTIME 90 capsule 0   pantoprazole (PROTONIX) 40 MG tablet Take 1 tablet by mouth daily.     predniSONE (STERAPRED UNI-PAK 21 TAB) 10 MG (21) TBPK tablet Take by mouth daily. Take 6 tabs by mouth daily  for 2 days, then 5 tabs for 2 days, then 4 tabs for 2 days, then 3 tabs for 2 days, 2 tabs for 2 days, then 1 tab by mouth daily for 2 days 42 tablet 0   No current facility-administered medications on file prior to visit.    ALLERGIES: No Known Allergies  FAMILY HISTORY: Family History  Problem Relation Age of Onset   Healthy Mother    Hypertension Father    Obesity Sister       Objective:  Blood pressure 137/78, pulse 100, height 5\' 6"  (1.676 m), weight 249 lb 3.2  oz (113 kg), last menstrual period 01/11/2022, SpO2 98 %. General: No acute distress.  Patient appears well-groomed.   Head:  Normocephalic/atraumatic Eyes:  Fundi examined but not visualized Neck: supple, no paraspinal tenderness, full range of motion Heart:  Regular rate and rhythm Lungs:  Clear to auscultation bilaterally Back: No paraspinal tenderness Neurological Exam: alert and oriented to person, place, and time.  Speech fluent and not dysarthric, language intact.  CN II-XII intact. Bulk and tone normal, muscle strength 5/5 throughout.  Sensation to light touch intact.  Deep tendon reflexes 2+ throughout, toes downgoing.  Finger to nose testing intact.  Gait normal, Romberg negative.   Metta Clines, DO  CC: Denyce Robert, FNP

## 2022-01-21 ENCOUNTER — Other Ambulatory Visit: Payer: Self-pay

## 2022-01-21 ENCOUNTER — Encounter: Payer: Self-pay | Admitting: Neurology

## 2022-01-21 ENCOUNTER — Ambulatory Visit: Payer: BC Managed Care – PPO | Admitting: Neurology

## 2022-01-21 VITALS — BP 137/78 | HR 100 | Ht 66.0 in | Wt 249.2 lb

## 2022-01-21 DIAGNOSIS — G253 Myoclonus: Secondary | ICD-10-CM | POA: Diagnosis not present

## 2022-01-21 DIAGNOSIS — G43009 Migraine without aura, not intractable, without status migrainosus: Secondary | ICD-10-CM

## 2022-01-21 DIAGNOSIS — E538 Deficiency of other specified B group vitamins: Secondary | ICD-10-CM | POA: Diagnosis not present

## 2022-01-21 DIAGNOSIS — R29898 Other symptoms and signs involving the musculoskeletal system: Secondary | ICD-10-CM | POA: Diagnosis not present

## 2022-01-21 DIAGNOSIS — F8081 Childhood onset fluency disorder: Secondary | ICD-10-CM | POA: Diagnosis not present

## 2022-01-21 NOTE — Patient Instructions (Addendum)
Check MRI of cervical spine with and without contrast.We have sent a referral to [facility] for your MRI and they will call you directly to schedule your appointment. They are located at [address] [facility]. If you need to contact them directly please call [phone].  Continue B12 daily No change to other medications at this time Move up follow up in November to 6 months Further recommendations pending results.

## 2022-02-01 ENCOUNTER — Ambulatory Visit
Admission: RE | Admit: 2022-02-01 | Discharge: 2022-02-01 | Disposition: A | Discharge status: Home / Self Care | Payer: BC Managed Care – PPO | Source: Ambulatory Visit | Attending: Neurology | Admitting: Neurology

## 2022-02-01 ENCOUNTER — Other Ambulatory Visit: Payer: Self-pay

## 2022-02-01 DIAGNOSIS — R29898 Other symptoms and signs involving the musculoskeletal system: Secondary | ICD-10-CM

## 2022-02-01 DIAGNOSIS — G253 Myoclonus: Secondary | ICD-10-CM

## 2022-02-01 IMAGING — MR MR CERVICAL SPINE WO/W CM
5 of 8 series · 28 of 48 positions shown · IV contrast (multihance)
Comparison: Comparison made with recent brain MRI from [DATE].

CLINICAL DATA: Initial evaluation for focal mild clonus.

EXAM:
MRI CERVICAL SPINE WITHOUT AND WITH CONTRAST
TECHNIQUE: Multiplanar and multiecho pulse sequences of the cervical spine, to
include the craniocervical junction and cervicothoracic junction,
were obtained without and with intravenous contrast.
CONTRAST:  20mL MULTIHANCE GADOBENATE DIMEGLUMINE 529 MG/ML IV SOLN

[Series 5: T2 · sagittal · 3.0mm · 0.55mm/px · 4 of 15 slices shown (1 of 2)]
[im 1/15]
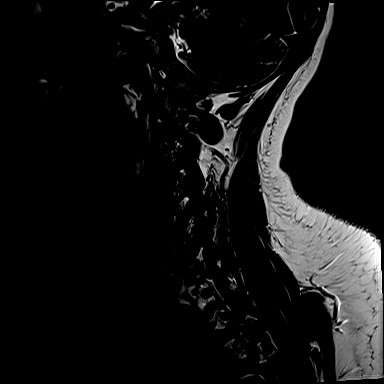
[im 5/15]
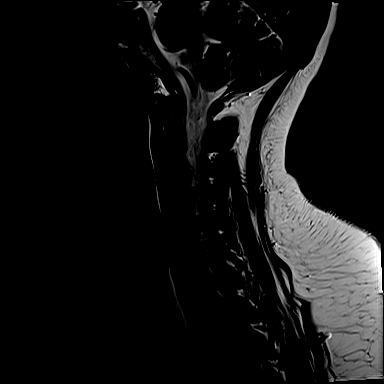
[im 10/15]
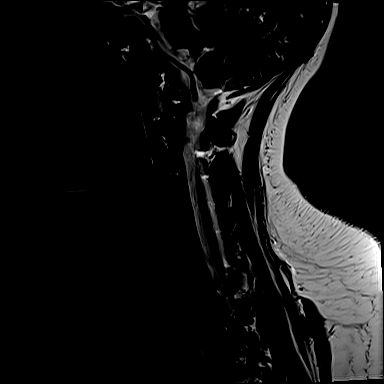
[im 15/15]
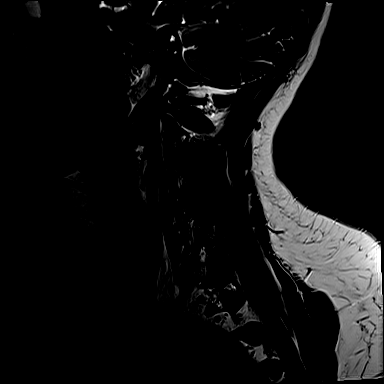

[Series 6: T1 · sagittal · 3.0mm · 0.66mm/px · 4 of 15 slices shown (1 of 3)]
[im 1/15]
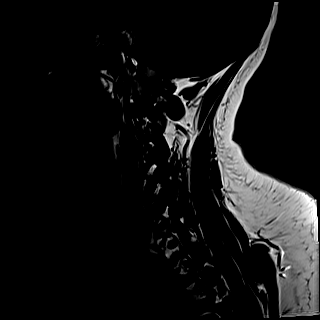
[im 5/15]
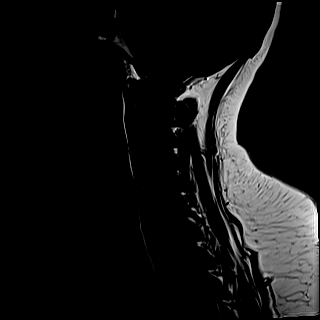
[im 10/15]
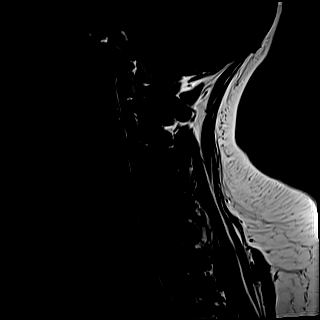
[im 15/15]
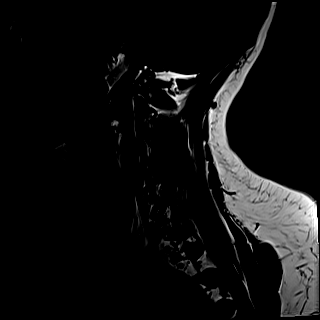

[Series 8: T2 · axial · 3.0mm · 0.50mm/px · z∈[-115,-15]mm · 8 of 32 slices shown (2 of 2)]
[im 1/32]
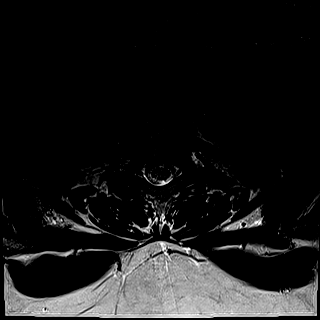
[im 5/32]
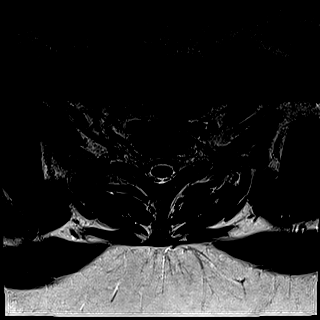
[im 9/32]
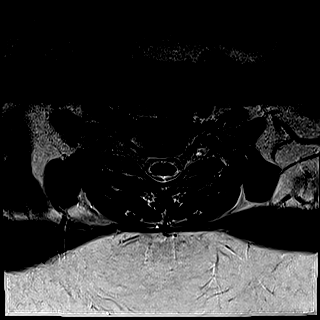
[im 14/32]
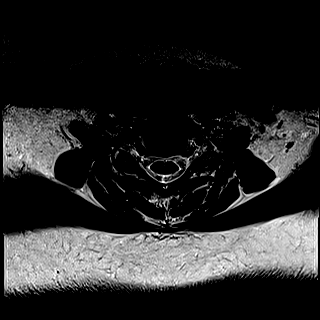
[im 18/32]
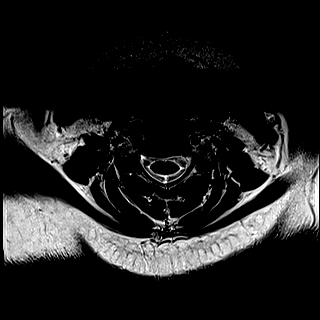
[im 23/32]
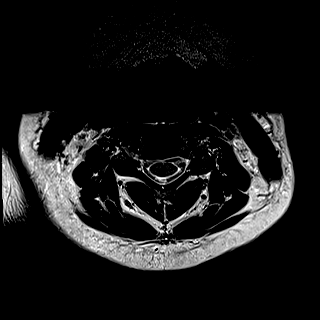
[im 27/32]
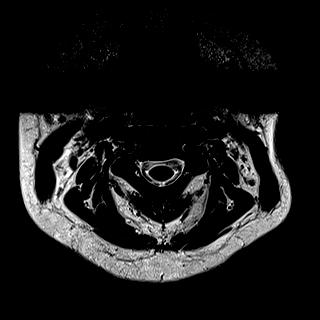
[im 32/32]
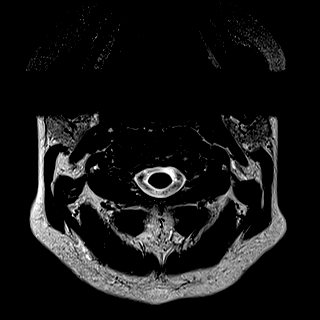

[Series 10: T1 · axial · non-contrast · 3.0mm · 0.31mm/px · z∈[-115,-15]mm · 8 of 32 slices shown (2 of 3)]
[im 1/32]
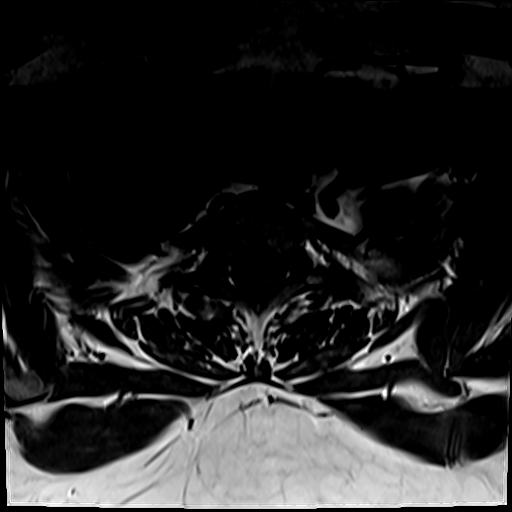
[im 5/32]
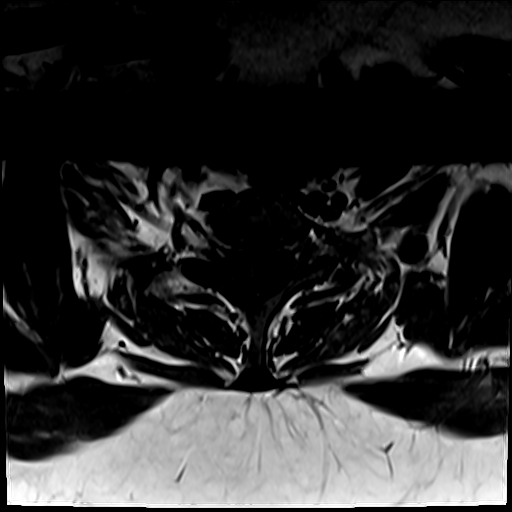
[im 9/32]
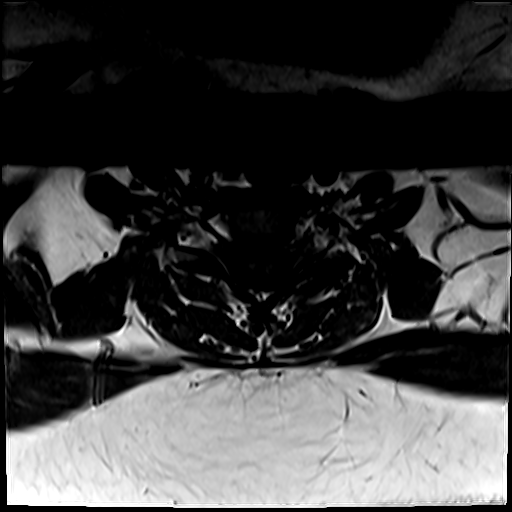
[im 14/32]
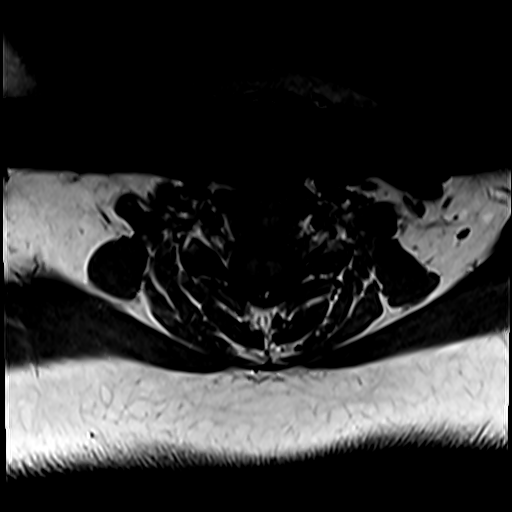
[im 18/32]
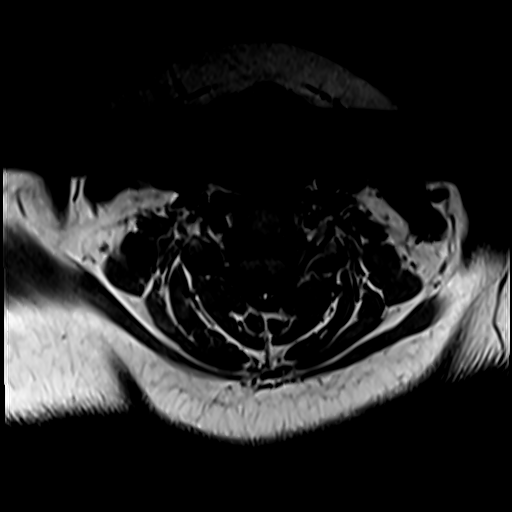
[im 23/32]
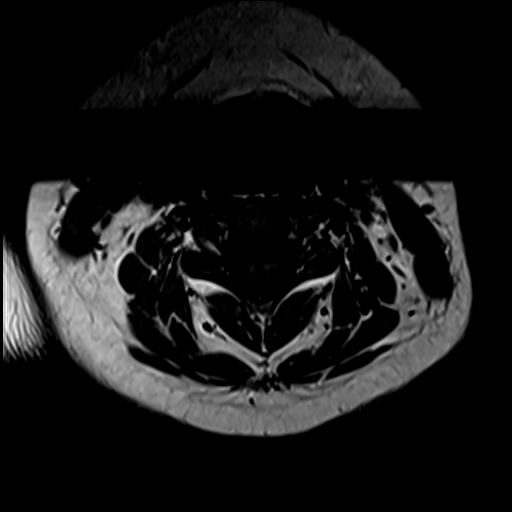
[im 27/32]
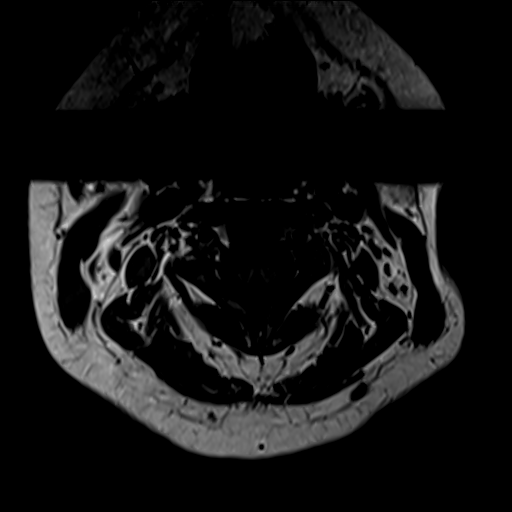
[im 32/32]
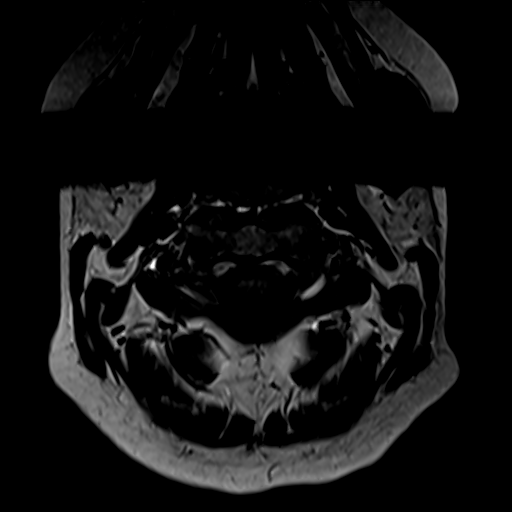

[Series 12: T1 · axial · 3.0mm · 0.31mm/px · z∈[-115,-73]mm · 4 of 32 slices shown (3 of 3)]
[im 1/32]
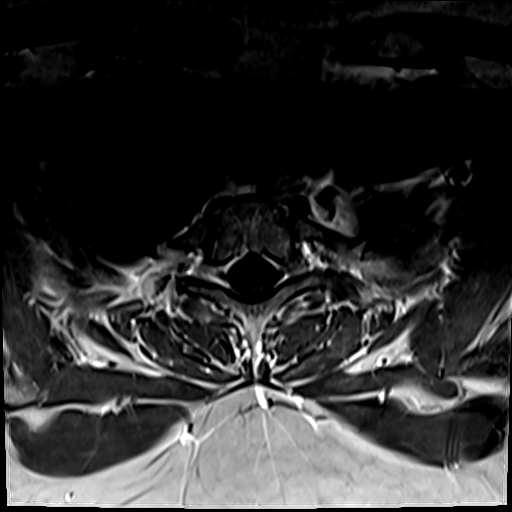
[im 5/32]
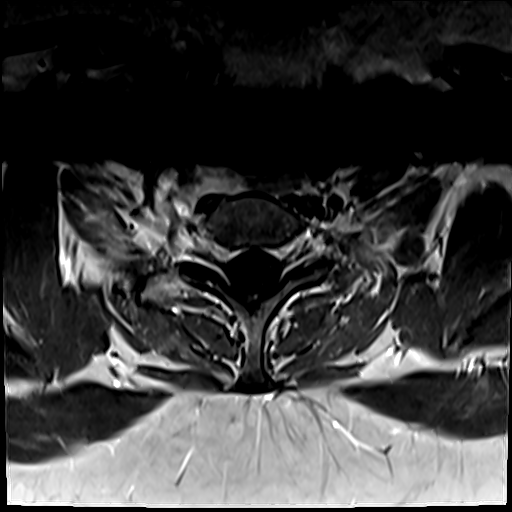
[im 9/32]
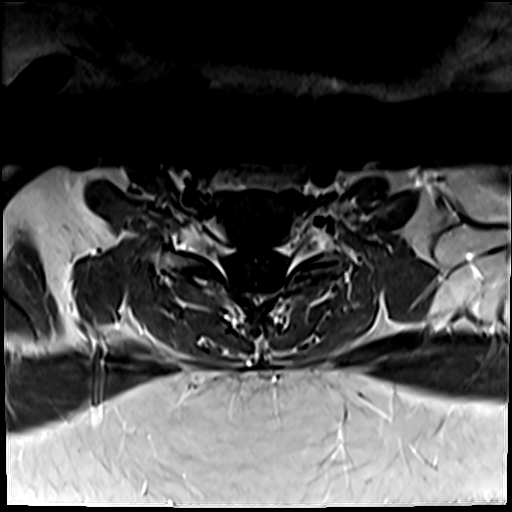
[im 14/32]
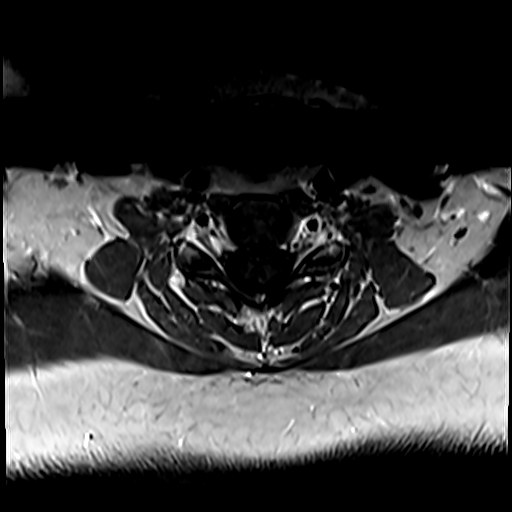

[28 of 48 positions shown; findings below may reference images not displayed]

FINDINGS: Alignment: Straightening of the normal cervical lordosis. No
listhesis.

Vertebrae: Vertebral body height maintained without acute or chronic
fracture. Bone marrow signal intensity within normal limits. No
discrete or worrisome osseous lesions. No abnormal marrow edema or
enhancement.

Cord: Normal signal and morphology. No cord signal changes to
suggest demyelinating disease or other abnormality. No abnormal
enhancement

Posterior Fossa, vertebral arteries, paraspinal tissues:
Craniocervical junction within normal limits. Paraspinous soft
tissues normal. Normal flow voids seen within the vertebral arteries
bilaterally.

Disc levels:

No significant disc pathology seen within the cervical spine. No
disc bulge or focal disc herniation. No significant facet disease.
No canal or neural foraminal stenosis or evidence for neural
impingement.
IMPRESSION: Normal MRI of the cervical spine and spinal cord. No evidence for
demyelinating disease or other abnormality.

## 2022-02-01 MED ORDER — GADOBENATE DIMEGLUMINE 529 MG/ML IV SOLN
20.0000 mL | Freq: Once | INTRAVENOUS | Status: AC | PRN
  Administered 2022-02-01: 20 mL via INTRAVENOUS

## 2022-02-03 NOTE — Progress Notes (Signed)
Patient advised of MRI results.

## 2022-02-03 NOTE — Progress Notes (Signed)
Tried calling patient no answer. Unable to LVM. ?

## 2022-02-05 ENCOUNTER — Encounter: Payer: Self-pay | Admitting: Neurology

## 2022-02-05 NOTE — Progress Notes (Signed)
02/02/22, Pt advised CMA that she will find another doctor that is willing to help her find out what is wrong. Thank for nothing Dr.Jaffe.    Dr.Jaffe was advised at the time if conversation how the patient felt.

## 2022-02-10 ENCOUNTER — Telehealth: Payer: Self-pay | Admitting: Neurology

## 2022-02-10 NOTE — Telephone Encounter (Signed)
Patient dismissed from [facility] by Adam Jaffe, DO, effective 02/02/22. Dismissal Letter sent out by 1st class mail. KLM   °

## 2022-02-15 ENCOUNTER — Other Ambulatory Visit: Payer: Self-pay | Admitting: Neurology

## 2022-04-08 ENCOUNTER — Other Ambulatory Visit: Payer: Self-pay | Admitting: Neurology

## 2022-05-06 ENCOUNTER — Encounter: Payer: Self-pay | Admitting: Adult Health

## 2022-05-06 ENCOUNTER — Ambulatory Visit (INDEPENDENT_AMBULATORY_CARE_PROVIDER_SITE_OTHER): Payer: BC Managed Care – PPO | Admitting: Adult Health

## 2022-05-06 VITALS — BP 133/96 | HR 78 | Ht 66.0 in | Wt 244.0 lb

## 2022-05-06 DIAGNOSIS — Z3041 Encounter for surveillance of contraceptive pills: Secondary | ICD-10-CM

## 2022-05-06 MED ORDER — ESTARYLLA 0.25-35 MG-MCG PO TABS: 1.0000 | ORAL_TABLET | Freq: Every day | ORAL | 12 refills | Status: DC

## 2022-05-06 NOTE — Progress Notes (Signed)
?  Subjective:  ?  ? Patient ID: Martinique M Graves, female   DOB: [birth year], 31 y.o.   MRN: [id number] ? ?HPI ?Martinique is a 31 year old white female, single, G0P0, in to discuss birth control is on the pill and was told that Topamax can decrease effectiveness but she likes this pill, says hormones are good and bleeding is good. ? ?Lab Results  ?Component Value Date  ? DIAGPAP  05/20/2021  ?  - Negative for intraepithelial lesion or malignancy (NILM)  ? Browning Negative 05/20/2021  ? PCP is Novella Rob, NP ?Review of Systems ?+headaches, ?migraines at times without aura,place on Topamax 25 mg recently ?Has been having neurological symptoms, like falling, stutters, mixes words, seeing Dr Merlene Laughter now ?Left ear hurts some, has allergies ?Reviewed past medical,surgical, social and family history. Reviewed medications and allergies.  ?   ?Objective:  ? Physical Exam ?BP (!) 133/96 (BP Location: Left Arm, Patient Position: Sitting, Cuff Size: Large)   Pulse 78   Ht 5\' 6"  (1.676 m)   Wt 244 lb (110.7 kg)   LMP 05/03/2022 (Exact Date)   BMI 39.38 kg/m?   ?  Skin warm and dry.Lungs: clear to ausculation bilaterally. Cardiovascular: regular rate and rhythm. Right ear has wax, left ear has wax but can se TM no fluid, mild ly red canal. ? ? Upstream - 05/06/22 1522   ? ?  ? Pregnancy Intention Screening  ? Does the patient want to become pregnant in the next year? No   ? Does the patient's partner want to become pregnant in the next year? No   ? Would the patient like to discuss contraceptive options today? Yes   ?  ? Contraception Wrap Up  ? Current Method Oral Contraceptive   ? End Method Oral Contraceptive;Female Condom   ? Contraception Counseling Provided Yes   ? ?  ?  ? ?  ?  ?Assessment:  ?   ?1. Encounter for surveillance of contraceptive pills ?Continue OCs and use condoms ?She does not wants phexxi,depo or IUD, did discuss with Dr Nelda Marseille, continue OCs and use condoms  ?Meds ordered this encounter  ?Medications  ? ESTARYLLA  0.25-35 MG-MCG tablet  ?  Sig: Take 1 tablet by mouth daily.  ?  Dispense:  28 tablet  ?  Refill:  12  ?  Order Specific Question:   Supervising Provider  ?  Answer:   Tania Ade H [[id number]]  ?  ?   ?Plan:  ?   ?Physical in 1 year ?   ?

## 2022-06-03 ENCOUNTER — Ambulatory Visit: Payer: BC Managed Care – PPO | Admitting: Allergy & Immunology

## 2022-06-03 ENCOUNTER — Encounter: Payer: Self-pay | Admitting: Allergy & Immunology

## 2022-06-03 VITALS — BP 138/90 | HR 71 | Temp 97.6°F | Resp 18 | Ht 66.0 in | Wt 233.4 lb

## 2022-06-03 DIAGNOSIS — J4530 Mild persistent asthma, uncomplicated: Secondary | ICD-10-CM | POA: Diagnosis not present

## 2022-06-03 DIAGNOSIS — J3089 Other allergic rhinitis: Secondary | ICD-10-CM

## 2022-06-03 DIAGNOSIS — J302 Other seasonal allergic rhinitis: Secondary | ICD-10-CM

## 2022-06-03 DIAGNOSIS — B999 Unspecified infectious disease: Secondary | ICD-10-CM | POA: Diagnosis not present

## 2022-06-03 NOTE — Patient Instructions (Addendum)
1. Mild persistent asthma, uncomplicated - Lung testing looked low, but it did improve with the albuterol use.  - Because of this, we are going to start a daily controller medication. - We are going to start Trelegy one puff once daily.  - Daily controller medication(s): Trelegy 100/62.5/25 one puff once daily - Prior to physical activity: albuterol 2 puffs 10-15 minutes before physical activity. - Rescue medications: albuterol 4 puffs every 4-6 hours as needed - Asthma control goals:  * Full participation in all desired activities (may need albuterol before activity) * Albuterol use two time or less a week on average (not counting use with activity) * Cough interfering with sleep two time or less a month * Oral steroids no more than once a year * No hospitalizations  2. Seasonal and perennial allergic rhinitis - Testing today showed: ragweed, trees, indoor molds, outdoor molds, cat, dog, and cockroach. - Copy of test results provided.  - Avoidance measures provided. - Stop taking: all of your current medications - Start taking: Xyzal (levocetirizine)  tablet once daily and Xhance (fluticasone) 1-2 sprays per nostril twice daily - You can use an extra dose of the antihistamine, if needed, for breakthrough symptoms.  - Consider nasal saline rinses 1-2 times daily to remove allergens from the nasal cavities as well as help with mucous clearance (this is especially helpful to do before the nasal sprays are given) - Consider allergy shots as a means of long-term control. - Allergy shots "re-train" and "reset" the immune system to ignore environmental allergens and decrease the resulting immune response to those allergens (sneezing, itchy watery eyes, runny nose, nasal congestion, etc).    - Allergy shots improve symptoms in 75-85% of patients.  - We can discuss more at the next appointment if the medications are not working for you.  3. Return in about 6 weeks (around 07/15/2022).     Please inform us of any Emergency Department visits, hospitalizations, or changes in symptoms. Call us before going to the ED for breathing or allergy symptoms since we might be able to fit you in for a sick visit. Feel free to contact us anytime with any questions, problems, or concerns.  It was a pleasure to meet you today!  Websites that have reliable patient information: 1. American Academy of Asthma, Allergy, and Immunology: www.aaaai.org 2. Food Allergy Research and Education (FARE): foodallergy.org 3. Mothers of Asthmatics: http://www.asthmacommunitynetwork.org 4. American College of Allergy, Asthma, and Immunology: www.acaai.org   COVID-19 Vaccine Information can be found at: [email or web address] For questions related to vaccine distribution or appointments, please email [email or web address] or call [phone].   We realize that you might be concerned about having an allergic reaction to the COVID19 vaccines. To help with that concern, WE ARE OFFERING THE COVID19 VACCINES IN OUR OFFICE! Ask the front desk for dates!     "Like" [place] on Facebook and Instagram for our latest updates!      A healthy democracy works best when [employer] participate! Make sure you are registered to vote! If you have moved or changed any of your contact information, you will need to get this updated before voting!  In some cases, you MAY be able to register to vote online: [email or web address]        Airborne Adult Perc - 06/03/22 1524     Time Antigen Placed 1500    Allergen Manufacturer Waynette Buttery    Location Back    Number of Test 59    1. Control-Buffer 50% Glycerol  Negative    2. Control-Histamine 1 mg/ml 2+    3. Albumin saline Negative    4. Bahia Negative    5. [place] Negative    6. Johnson Negative    7. [place] Blue Negative    8. Meadow Fescue Negative    9. Perennial Rye Negative    10.  Sweet Vernal Negative    11. Timothy Negative    12. Cocklebur Negative    13. Burweed Marshelder Negative    14. Ragweed, short Negative    15. Ragweed, Giant Negative    16. Plantain,  English Negative    17. Lamb's Quarters Negative    18. Sheep Sorrell Negative    19. Rough Pigweed Negative    20. Marsh Elder, Rough Negative    21. Mugwort, Common Negative    22. Ash mix Negative    23. Birch mix Negative    24. Beech American Negative    25. Box, Elder Negative    26. Cedar, red Negative    27. Cottonwood, [place] Negative    28. Elm mix Negative    29. Hickory Negative    30. Maple mix Negative    31. Oak, [place] mix Negative    32. Pecan Pollen Negative    33. Pine mix Negative    34. Sycamore Eastern Negative    35. Walnut, Black Pollen Negative    36. Alternaria alternata Negative    37. Cladosporium Herbarum Negative    38. Aspergillus mix Negative    39. Penicillium mix Negative    40. Bipolaris sorokiniana (Helminthosporium) Negative    41. Drechslera spicifera (Curvularia) Negative    42. Mucor plumbeus Negative    43. Fusarium moniliforme Negative    44. Aureobasidium pullulans (pullulara) Negative    45. Rhizopus oryzae Negative    46. Botrytis cinera Negative    47. Epicoccum nigrum Negative    48. Phoma betae Negative    49. Candida Albicans Negative    50. Trichophyton mentagrophytes Negative    51. Mite, D Farinae  5,000 AU/ml Negative    52. Mite, D Pteronyssinus  5,000 AU/ml Negative    53. Cat Hair 10,000 BAU/ml Negative    54.  Dog Epithelia Negative    55. Mixed Feathers Negative    56. Horse Epithelia Negative    57. Cockroach, German 2+    58. Mouse Negative    59. Tobacco Leaf Negative             Intradermal - 06/03/22 1555     Time Antigen Placed 1555    Allergen Manufacturer Waynette Buttery    Location Arm    Number of Test 14    Control Negative    [place] Negative    Johnson Negative    7 Grass Negative    Ragweed mix 2+     Weed mix Negative    Tree mix 2+    Mold 1 1+    Mold 2 2+    Mold 3 2+    Mold 4 2+    Cat 2+    Dog 2+    Mite mix Negative            Reducing Pollen Exposure  The American Academy of Allergy, Asthma and Immunology suggests the following steps to reduce your exposure to pollen during allergy seasons.    Do not hang sheets or clothing out to dry; pollen may collect on these items. Do not mow lawns  or spend time around freshly cut grass; mowing stirs up pollen. Keep windows closed at night.  Keep car windows closed while driving. Minimize morning activities outdoors, a time when pollen counts are usually at their highest. Stay indoors as much as possible when pollen counts or humidity is high and on windy days when pollen tends to remain in the air longer. Use air conditioning when possible.  Many air conditioners have filters that trap the pollen spores. Use a HEPA room air filter to remove pollen form the indoor air you breathe.  Control of Mold Allergen   Mold and fungi can grow on a variety of surfaces provided certain temperature and moisture conditions exist.  Outdoor molds grow on plants, decaying vegetation and soil.  The major outdoor mold, Alternaria and Cladosporium, are found in very high numbers during hot and dry conditions.  Generally, a late Summer - Fall peak is seen for common outdoor fungal spores.  Rain will temporarily lower outdoor mold spore count, but counts rise rapidly when the rainy period ends.  The most important indoor molds are Aspergillus and Penicillium.  Dark, humid and poorly ventilated basements are ideal sites for mold growth.  The next most common sites of mold growth are the bathroom and the kitchen.  Outdoor (Seasonal) Mold Control  Positive outdoor molds via skin testing: Alternaria, Cladosporium, Bipolaris (Helminthsporium), Drechslera (Curvalaria), and Mucor  Use air conditioning and keep windows closed Avoid exposure to decaying  vegetation. Avoid leaf raking. Avoid grain handling. Consider wearing a face mask if working in moldy areas.    Indoor (Perennial) Mold Control   Positive indoor molds via skin testing: Aspergillus, Penicillium, Fusarium, Aureobasidium (Pullulara), and Rhizopus  Maintain humidity below 50%. Clean washable surfaces with 5% bleach solution. Remove sources e.g. contaminated carpets.    Control of Dog or Cat Allergen  Avoidance is the best way to manage a dog or cat allergy. If you have a dog or cat and are allergic to dog or cats, consider removing the dog or cat from the home. If you have a dog or cat but don't want to find it a new home, or if your family wants a pet even though someone in the household is allergic, here are some strategies that may help keep symptoms at bay:  Keep the pet out of your bedroom and restrict it to only a few rooms. Be advised that keeping the dog or cat in only one room will not limit the allergens to that room. Don't pet, hug or kiss the dog or cat; if you do, wash your hands with soap and water. High-efficiency particulate air (HEPA) cleaners run continuously in a bedroom or living room can reduce allergen levels over time. Regular use of a high-efficiency vacuum cleaner or a central vacuum can reduce allergen levels. Giving your dog or cat a bath at least once a week can reduce airborne allergen.  Control of Cockroach Allergen  Cockroach allergen has been identified as an important cause of acute attacks of asthma, especially in urban settings.  There are fifty-five species of cockroach that exist in the [place], however only three, the [place], [place] species produce allergen that can affect patients with Asthma.  Allergens can be obtained from fecal particles, egg casings and secretions from cockroaches.    Remove food sources. Reduce access to water. Seal access and entry points. Spray runways with 0.5-1% Diazinon or  Chlorpyrifos Blow boric acid power under stoves and refrigerator. Place bait  stations (hydramethylnon) at feeding sites.  Allergy Shots   Allergies are the result of a chain reaction that starts in the immune system. Your immune system controls how your body defends itself. For instance, if you have an allergy to pollen, your immune system identifies pollen as an invader or allergen. Your immune system overreacts by producing antibodies called Immunoglobulin E (IgE). These antibodies travel to cells that release chemicals, causing an allergic reaction.  The concept behind allergy immunotherapy, whether it is received in the form of shots or tablets, is that the immune system can be desensitized to specific allergens that trigger allergy symptoms. Although it requires time and patience, the payback can be long-term relief.  How Do Allergy Shots Work?  Allergy shots work much like a vaccine. Your body responds to injected amounts of a particular allergen given in increasing doses, eventually developing a resistance and tolerance to it. Allergy shots can lead to decreased, minimal or no allergy symptoms.  There generally are two phases: build-up and maintenance. Build-up often ranges from three to six months and involves receiving injections with increasing amounts of the allergens. The shots are typically given once or twice a week, though more rapid build-up schedules are sometimes used.  The maintenance phase begins when the most effective dose is reached. This dose is different for each person, depending on how allergic you are and your response to the build-up injections. Once the maintenance dose is reached, there are longer periods between injections, typically two to four weeks.  Occasionally doctors give cortisone-type shots that can temporarily reduce allergy symptoms. These types of shots are different and should not be confused with allergy immunotherapy shots.  Who Can Be Treated with  Allergy Shots?  Allergy shots may be a good treatment approach for people with allergic rhinitis (hay fever), allergic asthma, conjunctivitis (eye allergy) or stinging insect allergy.   Before deciding to begin allergy shots, you should consider:   The length of allergy season and the severity of your symptoms  Whether medications and/or changes to your environment can control your symptoms  Your desire to avoid long-term medication use  Time: allergy immunotherapy requires a major time commitment  Cost: may vary depending on your insurance coverage  Allergy shots for children age 25 and older are effective and often well tolerated. They might prevent the onset of new allergen sensitivities or the progression to asthma.  Allergy shots are not started on patients who are pregnant but can be continued on patients who become pregnant while receiving them. In some patients with other medical conditions or who take certain common medications, allergy shots may be of risk. It is important to mention other medications you talk to your allergist.   When Will I Feel Better?  Some may experience decreased allergy symptoms during the build-up phase. For others, it may take as long as 12 months on the maintenance dose. If there is no improvement after a year of maintenance, your allergist will discuss other treatment options with you.  If you aren't responding to allergy shots, it may be because there is not enough dose of the allergen in your vaccine or there are missing allergens that were not identified during your allergy testing. Other reasons could be that there are high levels of the allergen in your environment or major exposure to non-allergic triggers like tobacco smoke.  What Is the Length of Treatment?  Once the maintenance dose is reached, allergy shots are generally continued for three to five  years. The decision to stop should be discussed with your allergist at that time. Some people  may experience a permanent reduction of allergy symptoms. Others may relapse and a longer course of allergy shots can be considered.  What Are the Possible Reactions?  The two types of adverse reactions that can occur with allergy shots are local and systemic. Common local reactions include very mild redness and swelling at the injection site, which can happen immediately or several hours after. A systemic reaction, which is less common, affects the entire body or a particular body system. They are usually mild and typically respond quickly to medications. Signs include increased allergy symptoms such as sneezing, a stuffy nose or hives.  Rarely, a serious systemic reaction called anaphylaxis can develop. Symptoms include swelling in the throat, wheezing, a feeling of tightness in the chest, nausea or dizziness. Most serious systemic reactions develop within 30 minutes of allergy shots. This is why it is strongly recommended you wait in your doctor's office for 30 minutes after your injections. Your allergist is trained to watch for reactions, and his or her staff is trained and equipped with the proper medications to identify and treat them.  Who Should Administer Allergy Shots?  The preferred location for receiving shots is your prescribing allergist's office. Injections can sometimes be given at another facility where the physician and staff are trained to recognize and treat reactions, and have received instructions by your prescribing allergist.
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
## 2022-06-09 ENCOUNTER — Telehealth: Payer: Self-pay | Admitting: Allergy & Immunology

## 2022-06-09 MED ORDER — XHANCE 93 MCG/ACT NA EXHU: 1.0000 | INHALANT_SUSPENSION | Freq: Two times a day (BID) | NASAL | 5 refills | Status: DC | PRN

## 2022-06-09 NOTE — Telephone Encounter (Signed)
Patient is requesting prescription for Xhance to be sent in. Advised pt it would be sent in to [facility] pharmacy and they would reach out to her to ship medication. Patient verbalized understanding.

## 2022-06-09 NOTE — Telephone Encounter (Signed)
Spoke with patient, informed her that script has been sent to [facility] and provided her with the number in case they do not reach out within 24-48 hours.Patient verbalized understanding.

## 2022-07-01 ENCOUNTER — Ambulatory Visit (HOSPITAL_COMMUNITY)
Admission: RE | Admit: 2022-07-01 | Discharge: 2022-07-01 | Disposition: A | Discharge status: Home / Self Care | Payer: BC Managed Care – PPO | Source: Ambulatory Visit | Attending: Nurse Practitioner | Admitting: Nurse Practitioner

## 2022-07-01 DIAGNOSIS — I499 Cardiac arrhythmia, unspecified: Secondary | ICD-10-CM | POA: Diagnosis present

## 2022-07-16 NOTE — Patient Instructions (Addendum)
Asthma Continue Trelegy 100-1 puff once a day to prevent cough or wheeze Continue albuterol 2 puffs once every 4 hours as needed for cough or wheeze You may use albuterol 2 puffs 5 to 15 minutes before activity to decrease cough or wheeze  Allergic rhinitis Continue allergen avoidance measures directed toward pollen, mold, pets, and cockroach as listed below Begin carbinoxamine 4 mg tablets. Take 2 tablets twice a day as needed for nasal symptoms  Continue Xhance nasal spray 2 sprays in each nostril twice a day for nasal symptoms Begin saline nasal rinses as needed for nasal symptoms. Use this before any medicated nasal sprays for best result Consider allergen immunotherapy if your symptoms are not well controlled with the treatment plan as listed above. Call the clinic if you are interested in allergen immunotherapy  Allergic conjunctivitis Consider using a lubricant drop such as Systane or refresh as needed Some over the counter eye drops include Pataday one drop in each eye once a day as needed for red, itchy eyes OR Zaditor one drop in each eye twice a day as needed for red itchy eyes.  Recurrent sinusitis Keep track of infections, antibiotic, and steroid use  Reflux Continue dietary and lifestyle modifications as listed below Continue pantoprazole 40 mg once a day as previously prescribed  Call the clinic if this treatment plan is not working well for you.  Follow up in 3 months or sooner if needed.  Reducing Pollen Exposure The American Academy of Allergy, Asthma and Immunology suggests the following steps to reduce your exposure to pollen during allergy seasons. Do not hang sheets or clothing out to dry; pollen may collect on these items. Do not mow lawns or spend time around freshly cut grass; mowing stirs up pollen. Keep windows closed at night.  Keep car windows closed while driving. Minimize morning activities outdoors, a time when pollen counts are usually at their  highest. Stay indoors as much as possible when pollen counts or humidity is high and on windy days when pollen tends to remain in the air longer. Use air conditioning when possible.  Many air conditioners have filters that trap the pollen spores. Use a HEPA room air filter to remove pollen form the indoor air you breathe.  Control of Mold Allergen Mold and fungi can grow on a variety of surfaces provided certain temperature and moisture conditions exist.  Outdoor molds grow on plants, decaying vegetation and soil.  The major outdoor mold, Alternaria and Cladosporium, are found in very high numbers during hot and dry conditions.  Generally, a late Summer - Fall peak is seen for common outdoor fungal spores.  Rain will temporarily lower outdoor mold spore count, but counts rise rapidly when the rainy period ends.  The most important indoor molds are Aspergillus and Penicillium.  Dark, humid and poorly ventilated basements are ideal sites for mold growth.  The next most common sites of mold growth are the bathroom and the kitchen.  Outdoor [employer] Use air conditioning and keep windows closed Avoid exposure to decaying vegetation. Avoid leaf raking. Avoid grain handling. Consider wearing a face mask if working in moldy areas.  Indoor Mold Control Maintain humidity below 50%. Clean washable surfaces with 5% bleach solution. Remove sources e.g. Contaminated carpets. Control of Dog or Cat Allergen Avoidance is the best way to manage a dog or cat allergy. If you have a dog or cat and are allergic to dog or cats, consider removing the dog or cat from the  home. If you have a dog or cat but don't want to find it a new home, or if your family wants a pet even though someone in the household is allergic, here are some strategies that may help keep symptoms at bay:  Keep the pet out of your bedroom and restrict it to only a few rooms. Be advised that keeping the dog or cat in only one room will not  limit the allergens to that room. Don't pet, hug or kiss the dog or cat; if you do, wash your hands with soap and water. High-efficiency particulate air (HEPA) cleaners run continuously in a bedroom or living room can reduce allergen levels over time. Regular use of a high-efficiency vacuum cleaner or a central vacuum can reduce allergen levels. Giving your dog or cat a bath at least once a week can reduce airborne allergen. Control of Cockroach Allergen Cockroach allergen has been identified as an important cause of acute attacks of asthma, especially in urban settings.  There are fifty-five species of cockroach that exist in the [place], however only three, the [place], [place] species produce allergen that can affect patients with Asthma.  Allergens can be obtained from fecal particles, egg casings and secretions from cockroaches.    Remove food sources. Reduce access to water. Seal access and entry points. Spray runways with 0.5-1% Diazinon or Chlorpyrifos Blow boric acid power under stoves and refrigerator. Place bait stations (hydramethylnon) at feeding sites.    Lifestyle Changes for Controlling GERD When you have GERD, stomach acid feels as if it's backing up toward your mouth. Whether or not you take medication to control your GERD, your symptoms can often be improved with lifestyle changes.   Raise Your Head Reflux is more likely to strike when you're lying down flat, because stomach fluid can flow backward more easily. Raising the head of your bed 4-6 inches can help. To do this: Slide blocks or books under the legs at the head of your bed. Or, place a wedge under the mattress. Many foam stores can make a suitable wedge for you. The wedge should run from your waist to the top of your head. Don't just prop your head on several pillows. This increases pressure on your stomach. It can make GERD worse.  Watch Your Eating Habits Certain foods may increase the  acid in your stomach or relax the lower esophageal sphincter, making GERD more likely. It's best to avoid the following: Coffee, tea, and carbonated drinks (with and without caffeine) Fatty, fried, or spicy food Mint, chocolate, onions, and tomatoes Any other foods that seem to irritate your stomach or cause you pain  Relieve the Pressure Eat smaller meals, even if you have to eat more often. Don't lie down right after you eat. Wait a few hours for your stomach to empty. Avoid tight belts and tight-fitting clothes. Lose excess weight.  Tobacco and Alcohol Avoid smoking tobacco and drinking alcohol. They can make GERD symptoms worse.

## 2022-07-16 NOTE — Progress Notes (Signed)
[address] [place] [address] Dept: [phone]  FOLLOW UP NOTE  Patient ID: Elizabeth Frank, female    DOB: [birth year]  Age: 31 y.o. MRN: [id number] Date of Office Visit: 07/17/2022  Assessment  Chief Complaint: Allergic Rhinitis  (Itchy, watery, crusty eyes, a lot of congestion. Has tried eye drops but are not working. ) and Asthma (Asthma is doing the same. A little cough here and there and some SOB.)  HPI Elizabeth JAZMYNN PHO is a 31 year old female who presents to the clinic for follow-up visit.  She was last seen in this clinic on 06/03/2022 by Dr. Dellis Anes as a new patient for evaluation of asthma, allergic rhinitis, and recurrent sinus infections.  At today's visit, she reports her asthma has been moderately well controlled with symptoms including shortness of breath with activity, wheezing 1 time since her last visit to this clinic, and occasional cough with activity which occasionally produces mucus and is occasionally dry.  She continues albuterol 5 to 15 minutes before activity and Trelegy 100-1 puff once a day.  She did run out of Trelegy 100 about 1 week ago.  She reports the Trelegy 100 was improving her breathing somewhat.  Allergic rhinitis is reported as moderately well controlled with symptoms including nasal congestion, sneezing, and postnasal drainage with frequent throat clearing and throat itching.  She continues Xyzol 5 mg once a day, Xhance 2 sprays in each nostril twice a day, and occasional nasal saline rinses.  She has previously tried Careers adviser, Claritin, Benadryl, and montelukast with no relief of symptoms. Her last environmental allergy skin testing was on 06/03/2022 and was positive to ragweed pollen, tree pollen, mold, cat, dog, and cockroach.  She has cats in her home that do sleep in her bed and her mother has cats and dogs in her home.  She is not interested in any way in getting rid of any animals at this time.  She is interested in looking into  allergen immunotherapy at this time.  Allergic conjunctivitis is reported as poorly controlled with symptoms including red, itchy, and crusty eyes for which she uses Visine clear eyes and another over-the-counter eyedrop which she reports has a cooling effect.  She reports that she has not pinkeye in January and received an ophthalmic antibiotic which she has used intermittently even after resolution of pinkeye.  Reflux is reported as well controlled with pantoprazole 40 mg once a day 30 minutes before her first meal.  Her current medications are listed in the chart.   Drug Allergies:  No Known Allergies  Physical Exam: BP 130/78   Pulse 73   Temp 98.2 F (36.8 C)   Resp 16   SpO2 98%    Physical Exam Vitals reviewed.  Constitutional:      Appearance: Normal appearance.  HENT:     Head: Normocephalic and atraumatic.     Right Ear: Tympanic membrane normal.     Left Ear: Tympanic membrane normal.     Nose:     Comments: Bilateral nares edematous and pale with clear nasal drainage noted.  Pharynx erythematous with no exudate.  Ears normal.  Eyes normal. Eyes:     Conjunctiva/sclera: Conjunctivae normal.  Cardiovascular:     Rate and Rhythm: Normal rate and regular rhythm.     Heart sounds: Normal heart sounds. No murmur heard. Pulmonary:     Effort: Pulmonary effort is normal.     Breath sounds: Normal breath sounds.     Comments:  Lungs clear to auscultation Musculoskeletal:        General: Normal range of motion.     Cervical back: Normal range of motion and neck supple.  Skin:    General: Skin is warm and dry.  Neurological:     Mental Status: She is alert and oriented to person, place, and time.  Psychiatric:        Mood and Affect: Mood normal.        Behavior: Behavior normal.        Thought Content: Thought content normal.        Judgment: Judgment normal.     Diagnostics: FVC 2.96, FEV1 2.15.  Predicted FVC 4.07, predicted FEV1 3.42.  Spirometry indicates  possible obstruction and restriction.  This is consistent with previous spirometry readings.  Assessment and Plan: 1. Moderate persistent asthma without complication   2. Seasonal and perennial allergic rhinitis   3. Recurrent infections   4. Gastroesophageal reflux disease, unspecified whether esophagitis present   5. Seasonal allergic conjunctivitis     Meds ordered this encounter  Medications   Fluticasone-Umeclidin-Vilant (TRELEGY ELLIPTA) 100-62.5-25 MCG/ACT AEPB    Sig: Inhale 1 puff into the lungs daily.    Dispense:  28 each    Refill:  5   Carbinoxamine Maleate 4 MG TABS    Sig: Take 2 tablets (8 mg total) by mouth 2 (two) times daily as needed.    Dispense:  60 tablet    Refill:  5   albuterol (VENTOLIN HFA) 108 (90 Base) MCG/ACT inhaler    Sig: Inhale 2 puffs into the lungs every 4 (four) hours as needed for wheezing or shortness of breath.    Dispense:  18 g    Refill:  2    Patient Instructions  Asthma Continue Trelegy 100-1 puff once a day to prevent cough or wheeze Continue albuterol 2 puffs once every 4 hours as needed for cough or wheeze You may use albuterol 2 puffs 5 to 15 minutes before activity to decrease cough or wheeze  Allergic rhinitis Continue allergen avoidance measures directed toward pollen, mold, pets, and cockroach as listed below Begin carbinoxamine 4 mg tablets. Take 2 tablets twice a day as needed for nasal symptoms  Continue Xhance nasal spray 2 sprays in each nostril twice a day for nasal symptoms Begin saline nasal rinses as needed for nasal symptoms. Use this before any medicated nasal sprays for best result Consider allergen immunotherapy if your symptoms are not well controlled with the treatment plan as listed above. Call the clinic if you are interested in allergen immunotherapy  Allergic conjunctivitis Consider using a lubricant drop such as Systane or refresh as needed Some over the counter eye drops include Pataday one drop in  each eye once a day as needed for red, itchy eyes OR Zaditor one drop in each eye twice a day as needed for red itchy eyes.  Recurrent sinusitis Keep track of infections, antibiotic, and steroid use  Reflux Continue dietary and lifestyle modifications as listed below Continue pantoprazole 40 mg once a day as previously prescribed  Call the clinic if this treatment plan is not working well for you.  Follow up in 3 months or sooner if needed.   Return in about 3 months (around 10/17/2022), or if symptoms worsen or fail to improve.    Thank you for the opportunity to care for this patient.  Please do not hesitate to contact me with questions.  Thermon Leyland, FNP [facility]

## 2022-07-17 ENCOUNTER — Ambulatory Visit: Payer: BC Managed Care – PPO | Admitting: Family Medicine

## 2022-07-17 ENCOUNTER — Encounter: Payer: Self-pay | Admitting: Family Medicine

## 2022-07-17 VITALS — BP 130/78 | HR 73 | Temp 98.2°F | Resp 16

## 2022-07-17 DIAGNOSIS — J3089 Other allergic rhinitis: Secondary | ICD-10-CM

## 2022-07-17 DIAGNOSIS — H1013 Acute atopic conjunctivitis, bilateral: Secondary | ICD-10-CM

## 2022-07-17 DIAGNOSIS — B999 Unspecified infectious disease: Secondary | ICD-10-CM | POA: Insufficient documentation

## 2022-07-17 DIAGNOSIS — K219 Gastro-esophageal reflux disease without esophagitis: Secondary | ICD-10-CM | POA: Diagnosis not present

## 2022-07-17 DIAGNOSIS — J4540 Moderate persistent asthma, uncomplicated: Secondary | ICD-10-CM

## 2022-07-17 DIAGNOSIS — J302 Other seasonal allergic rhinitis: Secondary | ICD-10-CM | POA: Insufficient documentation

## 2022-07-17 DIAGNOSIS — H1010 Acute atopic conjunctivitis, unspecified eye: Secondary | ICD-10-CM | POA: Insufficient documentation

## 2022-07-17 MED ORDER — ALBUTEROL SULFATE HFA 108 (90 BASE) MCG/ACT IN AERS: 2.0000 | INHALATION_SPRAY | RESPIRATORY_TRACT | 2 refills | Status: DC | PRN

## 2022-07-17 MED ORDER — TRELEGY ELLIPTA 100-62.5-25 MCG/ACT IN AEPB: 1.0000 | INHALATION_SPRAY | Freq: Every day | RESPIRATORY_TRACT | 5 refills | Status: DC

## 2022-07-17 MED ORDER — CARBINOXAMINE MALEATE 4 MG PO TABS: 2.0000 | ORAL_TABLET | Freq: Two times a day (BID) | ORAL | 5 refills | Status: DC | PRN

## 2022-08-10 ENCOUNTER — Ambulatory Visit: Payer: BC Managed Care – PPO | Admitting: Neurology

## 2022-08-28 ENCOUNTER — Encounter: Payer: Self-pay | Admitting: Cardiology

## 2022-08-28 ENCOUNTER — Telehealth: Payer: Self-pay | Admitting: Cardiology

## 2022-08-28 ENCOUNTER — Ambulatory Visit: Payer: BC Managed Care – PPO | Attending: Cardiology

## 2022-08-28 ENCOUNTER — Ambulatory Visit: Payer: BC Managed Care – PPO | Attending: Cardiology | Admitting: Cardiology

## 2022-08-28 ENCOUNTER — Other Ambulatory Visit: Payer: Self-pay | Admitting: Cardiology

## 2022-08-28 VITALS — BP 136/88 | HR 60 | Ht 66.0 in | Wt 229.6 lb

## 2022-08-28 DIAGNOSIS — R0602 Shortness of breath: Secondary | ICD-10-CM | POA: Diagnosis not present

## 2022-08-28 DIAGNOSIS — R002 Palpitations: Secondary | ICD-10-CM

## 2022-08-28 NOTE — Patient Instructions (Addendum)
Medication Instructions:  Your physician recommends that you continue on your current medications as directed. Please refer to the Current Medication list given to you today.  Labwork: none  Testing/Procedures: Your physician has requested that you have an echocardiogram. Echocardiography is a painless test that uses sound waves to create images of your heart. It provides your doctor with information about the size and shape of your heart and how well your heart's chambers and valves are working. This procedure takes approximately one hour. There are no restrictions for this procedure. ZIO- Long Term Monitor Instructions   Your physician has requested you wear your ZIO patch monitor 7 days.   This is a single patch monitor.  [employer] supplies one patch monitor per enrollment.  Additional stickers are not available.   Please do not apply patch if you will be having a Nuclear Stress Test, Echocardiogram, Cardiac CT, MRI, or Chest Xray during the time frame you would be wearing the monitor. The patch cannot be worn during these tests.  You cannot remove and re-apply the ZIO XT patch monitor.   Your ZIO patch monitor will be sent USPS Priority mail from [employer] directly to your home address. The monitor may also be mailed to a PO BOX if home delivery is not available.   It may take 3-5 days to receive your monitor after you have been enrolled.   Once you have received you monitor, please review enclosed instructions.  Your monitor has already been registered assigning a specific monitor serial # to you.   Applying the monitor   Shave hair from upper left chest.   Hold abrader disc by orange tab.  Rub abrader in 40 strokes over left upper chest as indicated in your monitor instructions.   Clean area with 4 enclosed alcohol pads .  Use all pads to assure are is cleaned thoroughly.  Let dry.   Apply patch as indicated in monitor instructions.  Patch will be place under collarbone on  left side of chest with arrow pointing upward.   Rub patch adhesive wings for 2 minutes.Remove white label marked "1".  Remove white label marked "2".  Rub patch adhesive wings for 2 additional minutes.   While looking in a mirror, press and release button in center of patch.  A small green light will flash 3-4 times .  This will be your only indicator the monitor has been turned on.     Do not shower for the first 24 hours.  You may shower after the first 24 hours.   Press button if you feel a symptom. You will hear a small click.  Record Date, Time and Symptom in the Patient Log Book.   When you are ready to remove patch, follow instructions on last 2 pages of Patient Log Book.  Stick patch monitor onto last page of Patient Log Book.   Place Patient Log Book in Blue box.  Use locking tab on box and tape box closed securely.  The Orange and White box has prepaid postage on it.  Please place in mailbox as soon as possible.  Your physician should have your test results approximately 7 days after the monitor has been mailed back to [employer].   Call [employer] Customer Care at [phone] if you have questions regarding your ZIO XT patch monitor.  Call them immediately if you see an orange light blinking on your monitor.   If your monitor falls off in less than 4 days contact our   Monitor department at [phone].  If your monitor becomes loose or falls off after 4 days call [employer] at [phone] for suggestions on securing your monitor.  Follow-Up: Your physician recommends that you schedule a follow-up appointment in: pending  Any Other Special Instructions Will Be Listed Below (If Applicable).  If you need a refill on your cardiac medications before your next appointment, please call your pharmacy. 

## 2022-08-28 NOTE — Progress Notes (Signed)
Cardiology Office Note  Date: 08/28/2022   ID: Elizabeth Frank, DOB 18-May-1991, MRN [id number]  PCP:  Marylynn Pearson, FNP  Cardiologist:  Nona Dell, MD Electrophysiologist:  None   Chief Complaint  Patient presents with   Palpitations    History of Present Illness: Elizabeth Frank is a 31 y.o. female referred for cardiology consultation by Ms. McCorkle NP for evaluation of abnormal ECG and palpitations.  She describes an occasional sense of heart fluttering, not precipitated by anything in particular, not associated with chest pain or syncope.  This occurs as frequently as once a week.  She has been working with her PCP on blood pressure control, has undergone medication adjustments, was taken off HCTZ most recently.  Has been consistent with Coreg.  Also on medications for ADHD and anxiety/depression.  She does feel short of breath with some activities, but states that she has gained a significant amount of weight.  Trying to lose weight currently.  I reviewed the tracing in question from July which showed sinus rhythm, nondiagnostic for septal infarct however.  Likely related to lead placement.  Past Medical History:  Diagnosis Date   ADHD    Allergic rhinitis    Anxiety    Asthma    Frequent headaches    Hypertension    Recurrent sinusitis     Past Surgical History:  Procedure Laterality Date   OVARIAN CYST REMOVAL     TOOTH EXTRACTION      Current Outpatient Medications  Medication Sig Dispense Refill   albuterol (VENTOLIN HFA) 108 (90 Base) MCG/ACT inhaler Inhale 2 puffs into the lungs every 4 (four) hours as needed for wheezing or shortness of breath. 18 g 2   Carbinoxamine Maleate 4 MG TABS Take 2 tablets (8 mg total) by mouth 2 (two) times daily as needed. 60 tablet 5   carvedilol (COREG) 6.25 MG tablet Take 6.25 mg by mouth 2 (two) times daily.     FLUoxetine (PROZAC) 40 MG capsule Take 80 mg by mouth daily.     Fluticasone Propionate (XHANCE) 93  MCG/ACT EXHU Place 1-2 sprays into the nose 2 (two) times daily as needed. 32 mL 5   Fluticasone-Umeclidin-Vilant (TRELEGY ELLIPTA) 100-62.5-25 MCG/ACT AEPB Inhale 1 puff into the lungs daily. 28 each 5   gabapentin (NEURONTIN) 300 MG capsule Take 300 mg by mouth 3 (three) times daily.     pantoprazole (PROTONIX) 40 MG tablet Take 1 tablet by mouth daily.     topiramate (TOPAMAX) 25 MG tablet Take 75 mg by mouth daily.     VYVANSE 70 MG capsule Take 70 mg by mouth every morning.     No current facility-administered medications for this visit.   Allergies:  Patient has no known allergies.   Social History: The patient  reports that she has never smoked. She has never been exposed to tobacco smoke. She has never used smokeless tobacco. She reports current alcohol use. She reports current drug use. Frequency: 2.00 times per week. Drug: Marijuana.   Family History: The patient's family history includes Hypertension in her father and mother.   ROS: No orthopnea or PND.  Physical Exam: VS:  BP 136/88 (BP Location: Left Arm, Patient Position: Sitting, Cuff Size: Large)   Pulse 60   Ht 5\' 6"  (1.676 m)   Wt 229 lb 9.6 oz (104.1 kg)   SpO2 98%   BMI 37.06 kg/m , BMI Body mass index is 37.06 kg/m.  Wt Readings from Last  3 Encounters:  08/28/22 229 lb 9.6 oz (104.1 kg)  06/03/22 233 lb 6.4 oz (105.9 kg)  05/06/22 244 lb (110.7 kg)    General: Patient appears comfortable at rest. HEENT: Conjunctiva and lids normal. Neck: Supple, no elevated JVP or carotid bruits. Lungs: Clear to auscultation, nonlabored breathing at rest. Cardiac: Regular rate and rhythm, no S3 or significant systolic murmur, no pericardial rub. Abdomen: Bowel sounds present. Extremities: No pitting edema, distal pulses 2+. Skin: Warm and dry. Musculoskeletal: No kyphosis. Neuropsychiatric: Alert and oriented x3, affect grossly appropriate.  ECG:  An ECG dated 07/01/2022 was personally reviewed today and demonstrated:   Sinus rhythm.  Recent Labwork: 11/18/2021: TSH 2.78   Other Studies Reviewed Today:  No prior cardiac testing for review today.  Assessment and Plan:  1.  Question of abnormal ECG.  I reviewed the tracing in question from July which is actually normal with decreased R wave progression most likely related to lead placement, not diagnostic of infarct pattern.  Since she does report some shortness of breath, we will obtain an echocardiogram however to ensure normal LVEF, particularly with history of hypertension.  2.  Essential hypertension, currently on Coreg.  She was taken off HCTZ most recently by PCP.  Systolic is in the 130s today.  Weight loss would be beneficial.  3.  Intermittent palpitations without associated chest pain or syncope.  We will obtain a 7-day Zio patch for further investigation.  No changes in medications at this time.  Medication Adjustments/Labs and Tests Ordered: Current medicines are reviewed at length with the patient today.  Concerns regarding medicines are outlined above.   Tests Ordered: Orders Placed This Encounter  Procedures   ECHOCARDIOGRAM COMPLETE    Medication Changes: No orders of the defined types were placed in this encounter.   Disposition:  Follow up  test results.  Signed, Jonelle Sidle, MD, [facility] 08/28/2022 3:21 PM    [facility] [address] [place], [place], [place] [address] Phone: [phone]; Fax: [phone]

## 2022-08-28 NOTE — Telephone Encounter (Signed)
PERCERT:  7 DAY ZIO XT 

## 2022-09-01 ENCOUNTER — Other Ambulatory Visit: Payer: Self-pay | Admitting: Cardiology

## 2022-09-01 DIAGNOSIS — I499 Cardiac arrhythmia, unspecified: Secondary | ICD-10-CM

## 2022-09-01 DIAGNOSIS — R0602 Shortness of breath: Secondary | ICD-10-CM

## 2022-09-02 ENCOUNTER — Ambulatory Visit: Payer: BC Managed Care – PPO | Attending: Cardiology

## 2022-09-02 DIAGNOSIS — I499 Cardiac arrhythmia, unspecified: Secondary | ICD-10-CM

## 2022-09-02 DIAGNOSIS — R0602 Shortness of breath: Secondary | ICD-10-CM

## 2022-09-02 DIAGNOSIS — R002 Palpitations: Secondary | ICD-10-CM | POA: Diagnosis not present

## 2022-09-03 LAB — ECHOCARDIOGRAM COMPLETE
AR max vel: 2.68 cm2
AV Area VTI: 2.66 cm2
AV Area mean vel: 2.66 cm2
AV Mean grad: 3.8 mmHg
AV Peak grad: 7 mmHg
Ao pk vel: 1.32 m/s
Area-P 1/2: 4.1 cm2
Calc EF: 65 %
MV M vel: 2.66 m/s
MV Peak grad: 28.3 mmHg
S' Lateral: 2.69 cm
Single Plane A2C EF: 68.4 %
Single Plane A4C EF: 60.1 %

## 2022-11-11 ENCOUNTER — Ambulatory Visit: Payer: BC Managed Care – PPO | Admitting: Allergy & Immunology

## 2022-11-16 ENCOUNTER — Ambulatory Visit (INDEPENDENT_AMBULATORY_CARE_PROVIDER_SITE_OTHER): Payer: BC Managed Care – PPO | Admitting: Allergy & Immunology

## 2022-11-16 ENCOUNTER — Encounter: Payer: Self-pay | Admitting: Allergy & Immunology

## 2022-11-16 ENCOUNTER — Other Ambulatory Visit: Payer: Self-pay

## 2022-11-16 VITALS — BP 122/88 | HR 68 | Temp 98.4°F | Resp 16 | Ht 66.5 in | Wt 222.2 lb

## 2022-11-16 DIAGNOSIS — J4540 Moderate persistent asthma, uncomplicated: Secondary | ICD-10-CM

## 2022-11-16 DIAGNOSIS — B999 Unspecified infectious disease: Secondary | ICD-10-CM

## 2022-11-16 DIAGNOSIS — J3089 Other allergic rhinitis: Secondary | ICD-10-CM | POA: Diagnosis not present

## 2022-11-16 DIAGNOSIS — J302 Other seasonal allergic rhinitis: Secondary | ICD-10-CM

## 2022-11-16 MED ORDER — PANTOPRAZOLE SODIUM 40 MG PO TBEC: 40.0000 mg | DELAYED_RELEASE_TABLET | Freq: Every day | ORAL | 1 refills | Status: DC

## 2022-11-16 MED ORDER — TRELEGY ELLIPTA 100-62.5-25 MCG/ACT IN AEPB: 1.0000 | INHALATION_SPRAY | Freq: Every day | RESPIRATORY_TRACT | 1 refills | Status: DC

## 2022-11-16 MED ORDER — ALBUTEROL SULFATE HFA 108 (90 BASE) MCG/ACT IN AERS: 2.0000 | INHALATION_SPRAY | RESPIRATORY_TRACT | 2 refills | Status: DC | PRN

## 2022-11-16 MED ORDER — PREDNISONE 10 MG PO TABS: ORAL_TABLET | ORAL | 0 refills | Status: DC

## 2022-11-16 MED ORDER — CARBINOXAMINE MALEATE 4 MG PO TABS: 2.0000 | ORAL_TABLET | Freq: Two times a day (BID) | ORAL | 1 refills | Status: DC | PRN

## 2022-11-16 MED ORDER — XHANCE 93 MCG/ACT NA EXHU: 1.0000 | INHALANT_SUSPENSION | Freq: Two times a day (BID) | NASAL | 5 refills | Status: DC | PRN

## 2022-11-16 NOTE — Progress Notes (Unsigned)
FOLLOW UP  Date of Service/Encounter:  11/16/22   Assessment:   Mild persistent asthma, uncomplicated    Seasonal and perennial allergic rhinitis (ragweed, trees, indoor molds, outdoor molds, cat, dog, and cockroach)   Recurrent infections  Plan/Recommendations:   1. Mild persistent asthma, uncomplicated - Lung testing looked low, so we are going to start you on a prednisone burst. - We are also going to get some labs to rule out weird serious causes of asthma. - These should be drawn before starting the prednisone burst. - Hopefully we can avoid antibiotics.  - We are doing these labs to see if we could start injectable medications to help with your asthma.  - Daily controller medication(s): Trelegy 100/62.5/25 one puff once daily - Prior to physical activity: albuterol 2 puffs 10-15 minutes before physical activity. - Rescue medications: albuterol 4 puffs every 4-6 hours as needed - Asthma control goals:  * Full participation in all desired activities (may need albuterol before activity) * Albuterol use two time or less a week on average (not counting use with activity) * Cough interfering with sleep two time or less a month * Oral steroids no more than once a year * No hospitalizations  2. Seasonal and perennial allergic rhinitis - Previous testing showed: ragweed, trees, indoor molds, outdoor molds, cat, dog, and cockroach. - Continue taking: carbinoxamine 4 mg tablets (two tablets twice daily)  - Continue taking: Xhance (fluticasone) 1-2 sprays per nostril twice daily - You can use an extra dose of the antihistamine, if needed, for breakthrough symptoms.  - Consider nasal saline rinses 1-2 times daily to remove allergens from the nasal cavities as well as help with mucous clearance (this is especially helpful to do before the nasal sprays are given) - Consider allergy shots as a means of long-term control.  3. Return in about 2 months (around 01/16/2023).      Subjective:   Elizabeth Frank is a 31 y.o. female presenting today for follow up of  Chief Complaint  Patient presents with   Asthma    Has had several asthma attacks since she was last seen. Is coughing up a lot of mucus- green,yellow,white,brown and has gotten worse in the last couple of weeks. Feels some itchiness in her throat    Allergic Rhinitis     Had an allergy flare yesterday since she ran out of meds.     Elizabeth Frank has a history of the following: Patient Active Problem List   Diagnosis Date Noted   Moderate persistent asthma without complication [id number]   Seasonal and perennial allergic rhinitis 07/17/2022   Recurrent infections 07/17/2022   Gastroesophageal reflux disease 07/17/2022   Seasonal allergic conjunctivitis 07/17/2022   Encounter for gynecological examination with Papanicolaou smear of cervix 05/20/2021   Elevated BP without diagnosis of hypertension 05/20/2021   History of abnormal Pap smear 04/17/2013    History obtained from: chart review and patient.  Elizabeth is a 31 y.o. female presenting for a follow up visit.  She was last seen in July 2023.  At that time, we continued her on Trelegy 100 mcg 1 puff once daily as well as albuterol as needed.  For her allergic rhinitis, she was started on carbinoxamine 4 mg tablets 2 tablets twice daily as needed.  She was continued on [place].  For her GERD, she was continued on pantoprazole and dietary changes.  Since last visit, she has largely done well.   Asthma/Respiratory Symptom History: She has  been having a productive cough for a while. It has been fairly low in volume, but worsened over the past week or so. She has had no fever. She is taking her Trelegy, but she reports that her depression got very bad recently and she was not taking care of herself. She is getting back on track now, but  she is back on track with this. She estimates that she was inconsistent with her Trelegy and was not taking care of  herself. She has not had a fever at all during this time. She did have a couple of times where she thought she might have a fever, but the temperature was always normal.   Allergic Rhinitis Symptom History: She remains on the carbinoxamine as well as the Xhance. She does need a refill for the antihistamine. She has not had additional episodes of sinusitis requiring antibiotics.   She is a [employer], so she is around a lot of germs. She feels that she catches everything. We have not done an immune workup at all.   Otherwise, there have been no changes to her past medical history, surgical history, family history, or social history.    Review of Systems  Constitutional: Negative.  Negative for chills, fever, malaise/fatigue and weight loss.  HENT:  Positive for congestion and sinus pain. Negative for ear discharge and ear pain.   Eyes:  Negative for pain, discharge and redness.  Respiratory:  Negative for cough, sputum production, shortness of breath and wheezing.   Cardiovascular: Negative.  Negative for chest pain and palpitations.  Gastrointestinal:  Negative for abdominal pain, constipation, diarrhea, heartburn, nausea and vomiting.  Skin: Negative.  Negative for itching and rash.  Neurological:  Negative for dizziness and headaches.  Endo/Heme/Allergies:  Positive for environmental allergies. Does not bruise/bleed easily.       Objective:   Blood pressure 122/88, pulse 68, temperature 98.4 F (36.9 C), resp. rate 16, height 5' 6.5" (1.689 m), weight 222 lb 4 oz (100.8 kg), SpO2 96 %. Body mass index is 35.33 kg/m.    Physical Exam Vitals reviewed.  Constitutional:      Appearance: She is well-developed.     Comments: Pleasant and smiling.   HENT:     Head: Normocephalic and atraumatic.     Right Ear: Tympanic membrane, ear canal and external ear normal. No drainage, swelling or tenderness. Tympanic membrane is not injected, scarred, erythematous, retracted or  bulging.     Left Ear: Tympanic membrane, ear canal and external ear normal. No drainage, swelling or tenderness. Tympanic membrane is not injected, scarred, erythematous, retracted or bulging.     Nose: Mucosal edema and rhinorrhea present. No nasal deformity or septal deviation.     Right Turbinates: Enlarged, swollen and pale.     Left Turbinates: Enlarged, swollen and pale.     Right Sinus: No maxillary sinus tenderness or frontal sinus tenderness.     Left Sinus: No maxillary sinus tenderness or frontal sinus tenderness.     Comments: No nasal polyps.     Mouth/Throat:     Lips: Pink.     Mouth: Mucous membranes are moist. Mucous membranes are not pale and not dry.     Pharynx: Uvula midline.  Eyes:     General: Lids are normal. Allergic shiner present.        Right eye: No discharge.        Left eye: No discharge.     Conjunctiva/sclera: Conjunctivae normal.  Right eye: Right conjunctiva is not injected. No chemosis.    Left eye: Left conjunctiva is not injected. No chemosis.    Pupils: Pupils are equal, round, and reactive to light.  Cardiovascular:     Rate and Rhythm: Normal rate and regular rhythm.     Heart sounds: Normal heart sounds.  Pulmonary:     Effort: Pulmonary effort is normal. No tachypnea, accessory muscle usage or respiratory distress.     Breath sounds: Normal breath sounds. No wheezing, rhonchi or rales.  Chest:     Chest wall: No tenderness.  Abdominal:     Tenderness: There is no abdominal tenderness. There is no guarding or rebound.  Lymphadenopathy:     Head:     Right side of head: No submandibular, tonsillar or occipital adenopathy.     Left side of head: No submandibular, tonsillar or occipital adenopathy.     Cervical: No cervical adenopathy.  Skin:    General: Skin is warm.     Capillary Refill: Capillary refill takes less than 2 seconds.     Coloration: Skin is not pale.     Findings: No abrasion, erythema, petechiae or rash. Rash is not  papular, urticarial or vesicular.     Comments: Very sensitive skin. Dermatographism noted.   Neurological:     Mental Status: She is alert.  Psychiatric:        Behavior: Behavior is cooperative.      Diagnostic studies:    Spirometry: results abnormal (FEV1: 1.97/58%, FVC: 2.61/64%, FEV1/FVC: 75%).    Spirometry consistent with possible restrictive disease.    Allergy Studies: none        Malachi Bonds, MD  [facility]

## 2022-11-16 NOTE — Patient Instructions (Addendum)
1. Mild persistent asthma, uncomplicated - Lung testing looked low, so we are going to start you on a prednisone burst. - We are also going to get some labs to rule out weird serious causes of asthma. - These should be drawn before starting the prednisone burst. - Hopefully we can avoid antibiotics.  - We are doing these labs to see if we could start injectable medications to help with your asthma.  - Daily controller medication(s): Trelegy 100/62.5/25 one puff once daily - Prior to physical activity: albuterol 2 puffs 10-15 minutes before physical activity. - Rescue medications: albuterol 4 puffs every 4-6 hours as needed - Asthma control goals:  * Full participation in all desired activities (may need albuterol before activity) * Albuterol use two time or less a week on average (not counting use with activity) * Cough interfering with sleep two time or less a month * Oral steroids no more than once a year * No hospitalizations  2. Seasonal and perennial allergic rhinitis - Previous testing showed: ragweed, trees, indoor molds, outdoor molds, cat, dog, and cockroach. - Continue taking: carbinoxamine 4 mg tablets (two tablets twice daily)  - Continue taking: Xhance (fluticasone) 1-2 sprays per nostril twice daily - You can use an extra dose of the antihistamine, if needed, for breakthrough symptoms.  - Consider nasal saline rinses 1-2 times daily to remove allergens from the nasal cavities as well as help with mucous clearance (this is especially helpful to do before the nasal sprays are given) - Consider allergy shots as a means of long-term control.  3. Return in about 2 months (around 01/16/2023).    Please inform us of any Emergency Department visits, hospitalizations, or changes in symptoms. Call us before going to the ED for breathing or allergy symptoms since we might be able to fit you in for a sick visit. Feel free to contact us anytime with any questions, problems, or  concerns.  It was a pleasure to meet you today!  Websites that have reliable patient information: 1. American Academy of Asthma, Allergy, and Immunology: www.aaaai.org 2. Food Allergy Research and Education (FARE): foodallergy.org 3. Mothers of Asthmatics: http://www.asthmacommunitynetwork.org 4. American College of Allergy, Asthma, and Immunology: www.acaai.org   COVID-19 Vaccine Information can be found at: [email or web address] For questions related to vaccine distribution or appointments, please email [email or web address] or call [phone].   We realize that you might be concerned about having an allergic reaction to the COVID19 vaccines. To help with that concern, WE ARE OFFERING THE COVID19 VACCINES IN OUR OFFICE! Ask the front desk for dates!     "Like" [place] on Facebook and Instagram for our latest updates!      A healthy democracy works best when [employer] participate! Make sure you are registered to vote! If you have moved or changed any of your contact information, you will need to get this updated before voting!  In some cases, you MAY be able to register to vote online: [email or web address]     Allergy Shots   Allergies are the result of a chain reaction that starts in the immune system. Your immune system controls how your body defends itself. For instance, if you have an allergy to pollen, your immune system identifies pollen as an invader or allergen. Your immune system overreacts by producing antibodies called Immunoglobulin E (IgE). These antibodies travel to cells that release chemicals, causing an allergic reaction.  The concept behind allergy immunotherapy, whether it is received in the form of shots or tablets,  is that the immune system can be desensitized to specific allergens that trigger allergy symptoms. Although it requires time and patience, the payback can be long-term  relief.  How Do Allergy Shots Work?  Allergy shots work much like a vaccine. Your body responds to injected amounts of a particular allergen given in increasing doses, eventually developing a resistance and tolerance to it. Allergy shots can lead to decreased, minimal or no allergy symptoms.  There generally are two phases: build-up and maintenance. Build-up often ranges from three to six months and involves receiving injections with increasing amounts of the allergens. The shots are typically given once or twice a week, though more rapid build-up schedules are sometimes used.  The maintenance phase begins when the most effective dose is reached. This dose is different for each person, depending on how allergic you are and your response to the build-up injections. Once the maintenance dose is reached, there are longer periods between injections, typically two to four weeks.  Occasionally doctors give cortisone-type shots that can temporarily reduce allergy symptoms. These types of shots are different and should not be confused with allergy immunotherapy shots.  Who Can Be Treated with Allergy Shots?  Allergy shots may be a good treatment approach for people with allergic rhinitis (hay fever), allergic asthma, conjunctivitis (eye allergy) or stinging insect allergy.   Before deciding to begin allergy shots, you should consider:   The length of allergy season and the severity of your symptoms  Whether medications and/or changes to your environment can control your symptoms  Your desire to avoid long-term medication use  Time: allergy immunotherapy requires a major time commitment  Cost: may vary depending on your insurance coverage  Allergy shots for children age 27 and older are effective and often well tolerated. They might prevent the onset of new allergen sensitivities or the progression to asthma.  Allergy shots are not started on patients who are pregnant but can be continued on  patients who become pregnant while receiving them. In some patients with other medical conditions or who take certain common medications, allergy shots may be of risk. It is important to mention other medications you talk to your allergist.   When Will I Feel Better?  Some may experience decreased allergy symptoms during the build-up phase. For others, it may take as long as 12 months on the maintenance dose. If there is no improvement after a year of maintenance, your allergist will discuss other treatment options with you.  If you aren't responding to allergy shots, it may be because there is not enough dose of the allergen in your vaccine or there are missing allergens that were not identified during your allergy testing. Other reasons could be that there are high levels of the allergen in your environment or major exposure to non-allergic triggers like tobacco smoke.  What Is the Length of Treatment?  Once the maintenance dose is reached, allergy shots are generally continued for three to five years. The decision to stop should be discussed with your allergist at that time. Some people may experience a permanent reduction of allergy symptoms. Others may relapse and a longer course of allergy shots can be considered.  What Are the Possible Reactions?  The two types of adverse reactions that can occur with allergy shots are local and systemic. Common local reactions include very mild redness and swelling at the injection site, which can happen immediately or several hours after. A systemic reaction, which is less common, affects the entire  body or a particular body system. They are usually mild and typically respond quickly to medications. Signs include increased allergy symptoms such as sneezing, a stuffy nose or hives.  Rarely, a serious systemic reaction called anaphylaxis can develop. Symptoms include swelling in the throat, wheezing, a feeling of tightness in the chest, nausea or dizziness.  Most serious systemic reactions develop within 30 minutes of allergy shots. This is why it is strongly recommended you wait in your doctor's office for 30 minutes after your injections. Your allergist is trained to watch for reactions, and his or her staff is trained and equipped with the proper medications to identify and treat them.  Who Should Administer Allergy Shots?  The preferred location for receiving shots is your prescribing allergist's office. Injections can sometimes be given at another facility where the physician and staff are trained to recognize and treat reactions, and have received instructions by your prescribing allergist.

## 2022-11-18 ENCOUNTER — Ambulatory Visit: Payer: BC Managed Care – PPO | Admitting: Neurology

## 2022-11-20 LAB — CBC WITH DIFFERENTIAL/PLATELET
Basophils Absolute: 0 10*3/uL (ref 0.0–0.2)
Basos: 1 %
EOS (ABSOLUTE): 0.3 10*3/uL (ref 0.0–0.4)
Eos: 4 %
Hematocrit: 38.6 % (ref 34.0–46.6)
Hemoglobin: 12.6 g/dL (ref 11.1–15.9)
Immature Grans (Abs): 0 10*3/uL (ref 0.0–0.1)
Immature Granulocytes: 0 %
Lymphocytes Absolute: 2.5 10*3/uL (ref 0.7–3.1)
Lymphs: 40 %
MCH: 28.6 pg (ref 26.6–33.0)
MCHC: 32.6 g/dL (ref 31.5–35.7)
MCV: 88 fL (ref 79–97)
Monocytes Absolute: 0.5 10*3/uL (ref 0.1–0.9)
Monocytes: 7 %
Neutrophils Absolute: 3 10*3/uL (ref 1.4–7.0)
Neutrophils: 48 %
Platelets: 206 10*3/uL (ref 150–450)
RBC: 4.4 x10E6/uL (ref 3.77–5.28)
RDW: 13.9 % (ref 11.7–15.4)
WBC: 6.2 10*3/uL (ref 3.4–10.8)

## 2022-11-20 LAB — ASPERGILLUS PRECIPITINS
A.Fumigatus #1 Abs: NEGATIVE
Aspergillus Flavus Antibodies: NEGATIVE
Aspergillus Niger Antibodies: NEGATIVE
Aspergillus glaucus IgG: NEGATIVE
Aspergillus nidulans IgG: NEGATIVE
Aspergillus terreus IgG: NEGATIVE

## 2022-11-20 LAB — IGE: IgE (Immunoglobulin E), Serum: 116 IU/mL (ref 6–495)

## 2022-11-20 LAB — ANCA TITERS
Atypical pANCA: <1:20 {titer}
C-ANCA: <1:20 {titer}
P-ANCA: <1:20 {titer}

## 2022-11-20 LAB — ALPHA-1-ANTITRYPSIN: A-1 Antitrypsin: 151 mg/dL (ref 100–188)

## 2022-11-23 ENCOUNTER — Telehealth: Payer: Self-pay

## 2022-11-23 DIAGNOSIS — R052 Subacute cough: Secondary | ICD-10-CM

## 2022-11-23 DIAGNOSIS — J4540 Moderate persistent asthma, uncomplicated: Secondary | ICD-10-CM

## 2022-11-23 NOTE — Telephone Encounter (Signed)
Recommend getting a CXR to rule out infectious process for persistent mucoid cough. Order placed.

## 2022-11-23 NOTE — Telephone Encounter (Signed)
Patient called to give an update and she is still coughing a lot. She is coughing up green mucous and blowing out green mucous. It was more brown but has changed over to green.   [facility] [employer]

## 2022-11-24 ENCOUNTER — Ambulatory Visit (HOSPITAL_COMMUNITY)
Admission: RE | Admit: 2022-11-24 | Discharge: 2022-11-24 | Disposition: A | Discharge status: Home / Self Care | Payer: BC Managed Care – PPO | Source: Ambulatory Visit | Attending: Internal Medicine | Admitting: Internal Medicine

## 2022-11-24 DIAGNOSIS — R052 Subacute cough: Secondary | ICD-10-CM | POA: Diagnosis present

## 2022-11-24 DIAGNOSIS — J4540 Moderate persistent asthma, uncomplicated: Secondary | ICD-10-CM | POA: Diagnosis present

## 2022-11-24 NOTE — Telephone Encounter (Signed)
Thanks much!  Tylek Boney, MD [facility]  

## 2022-11-25 ENCOUNTER — Telehealth: Payer: Self-pay | Admitting: Allergy & Immunology

## 2022-11-25 NOTE — Telephone Encounter (Signed)
Patient is still not feeling well since last visit with Dr. Dellis Anes patient did have an allergy flare last night asking for a call from the nurse please advise

## 2022-11-26 NOTE — Telephone Encounter (Signed)
Did the prednisone do anything at all? She can also use Afrin BID for three days tops.   Malachi Bonds, MD [facility]

## 2022-11-26 NOTE — Telephone Encounter (Signed)
Called and spoke with patient and she stated that the Prednisone did not help and that her breathing is the same. She stated that she has been using the Afrin numerous times and it is not helping. She has been scheduled to start Fasenra to start next Wednesday in [place] and receive a sample. She asked if this medication would be covered or if she would have to pay a lot out of pocket? I advised that we will inform Tammy our biologic coordinator and she will start the approval process and will inform her of any out of pocket cost. Patient verbalized understanding. Patient did state that she is just going to go to the urgent care for her nasal symptoms as she is frustrated because she has heard from [place] 3 times now in regards to her issues and has not received any type of advice or antibiotic to help with her symptom. I did apologize that we caused her to feel that way, patient stated it was fine. She also asked if she can see another Dr. Dellis Anes that is more available than Dr. Dellis Anes in [place] since it seems like he is never there. I did advise that he is there on Wednesday and Thursday's and Dr. Allena Katz is there on Monday's since we are only open three days a week. Patient verbalized understanding but still did not seem pleased.

## 2022-11-26 NOTE — Telephone Encounter (Signed)
She can come in and get a sample tomorrow. We have tow of them in [place].   Malachi Bonds, MD [facility]

## 2022-11-27 MED ORDER — AMOXICILLIN-POT CLAVULANATE 875-125 MG PO TABS: 1.0000 | ORAL_TABLET | Freq: Two times a day (BID) | ORAL | 0 refills | Status: AC

## 2022-11-27 MED ORDER — FLUCONAZOLE 150 MG PO TABS: 150.0000 mg | ORAL_TABLET | Freq: Once | ORAL | 0 refills | Status: AC

## 2022-11-27 NOTE — Telephone Encounter (Signed)
I called her and she did not end up going to Urgent Care. She is having a lot of congestion bilaterally. She has not had antibiotics during this episode at all. She had the prednisone which did not help. She has no antibiotic allergies.  I sent in Augmentin BID for seven days. I also sent in Diflucan to add on if needed for a yeast infection.   She asked about the scoliosis read on the CXR. I reviewed the other readings and there is no mention of this. We can follow up with this in the future with repeat CXR and more targeted CXR.   She has an appointment for [facility] next week.  Malachi Bonds, MD [facility]

## 2022-11-27 NOTE — Telephone Encounter (Signed)
Did she go into Urgent Care? She could come in for a sample of Harrington Challenger if she wants to get that started.   We could also see her in clinic today.  Malachi Bonds, MD [facility]

## 2022-12-02 ENCOUNTER — Ambulatory Visit (INDEPENDENT_AMBULATORY_CARE_PROVIDER_SITE_OTHER): Payer: BC Managed Care – PPO

## 2022-12-02 DIAGNOSIS — J4540 Moderate persistent asthma, uncomplicated: Secondary | ICD-10-CM | POA: Diagnosis not present

## 2022-12-02 MED ORDER — BENRALIZUMAB 30 MG/ML ~~LOC~~ SOSY
30.0000 mg | PREFILLED_SYRINGE | SUBCUTANEOUS | Status: AC
  Administered 2022-12-02 – 2024-03-20 (×10): 30 mg via SUBCUTANEOUS

## 2022-12-02 NOTE — Progress Notes (Signed)
Immunotherapy   Patient Details  Name: Elizabeth Frank MRN: [id number] Date of Birth: 28-Sep-1991  12/02/2022  Elizabeth Frank started injections for  Harrington Challenger Following schedule: Every twenty eight days. Frequency:Every four weeks. Epi-Pen:Not needed. Consent signed in office today and patient instructions given. Patient waited in the lobby for thirty minutes without an issue.    Ralene Muskrat 12/02/2022, 4:00 PM

## 2022-12-23 ENCOUNTER — Telehealth: Payer: Self-pay | Admitting: Allergy & Immunology

## 2022-12-23 NOTE — Telephone Encounter (Signed)
This does not sound like anything to do with allergy or asthma. I would recommend that she follow up with her PCP.   Any recent vaccinations? It sounds almost like Guillain-Barre syndrome, which can be life threatening. This could also be something with her circulation, which again is managed by her PCP.   Consider going to the ED if this continues to worsens.   Salvatore Marvel, MD [facility]

## 2022-12-23 NOTE — Telephone Encounter (Signed)
Spoke to patient and advised her to follow up with her PCP.  Patient states that she already has an appointment them and just wanted to cover all her bases.

## 2022-12-23 NOTE — Telephone Encounter (Signed)
Attempted to call patient, there was no answer and phone number was not in service. Will need to attempt to call again.

## 2022-12-23 NOTE — Telephone Encounter (Signed)
Patient states she has been having numbness and tingling in her hands and feet. It started late last week with her right foot, she felt the tingling and numbness, almost like pins and needles. Patient states yesterday it started in her hands and her hands have gone ghost white - when they are usually red.   Patient spoke to her primary and her medication manager - the medication manage does not think it has something to do with her medication. Patient is scheduled to see her PCP but would like to know if Dr. Ernst Bowler would like to see her sooner.   Patient states she remembers our office asking about the tingling and numbness before which is why she is mentioning it.   Please advise  Best contact number: [phone]

## 2022-12-28 ENCOUNTER — Ambulatory Visit: Payer: BC Managed Care – PPO

## 2023-01-11 NOTE — Telephone Encounter (Signed)
Called patient and advised appeal now approved and will submit to [employer] and reach out once delivery set to make appt for next injection

## 2023-01-20 ENCOUNTER — Ambulatory Visit: Payer: BC Managed Care – PPO | Admitting: Allergy & Immunology

## 2023-01-29 ENCOUNTER — Telehealth: Payer: Self-pay | Admitting: *Deleted

## 2023-01-29 MED ORDER — FASENRA PEN 30 MG/ML ~~LOC~~ SOAJ: 30.0000 mg | SUBCUTANEOUS | 11 refills | Status: AC

## 2023-01-29 NOTE — Telephone Encounter (Signed)
Spoke to patient and advised delivery for [facility] 2/14 and appt to start therapy made. Patient wants to change over to autoinjectos so I wills end new Rx for same to [facility]

## 2023-02-05 ENCOUNTER — Ambulatory Visit (INDEPENDENT_AMBULATORY_CARE_PROVIDER_SITE_OTHER): Payer: BC Managed Care – PPO

## 2023-02-05 DIAGNOSIS — J4540 Moderate persistent asthma, uncomplicated: Secondary | ICD-10-CM | POA: Diagnosis not present

## 2023-02-05 MED ORDER — BENRALIZUMAB 30 MG/ML ~~LOC~~ SOSY: 30.0000 mg | PREFILLED_SYRINGE | SUBCUTANEOUS | Status: DC

## 2023-02-10 ENCOUNTER — Ambulatory Visit: Payer: BC Managed Care – PPO | Admitting: Allergy & Immunology

## 2023-03-05 ENCOUNTER — Ambulatory Visit (INDEPENDENT_AMBULATORY_CARE_PROVIDER_SITE_OTHER): Payer: BC Managed Care – PPO | Admitting: *Deleted

## 2023-03-05 DIAGNOSIS — J4540 Moderate persistent asthma, uncomplicated: Secondary | ICD-10-CM

## 2023-04-02 ENCOUNTER — Ambulatory Visit (INDEPENDENT_AMBULATORY_CARE_PROVIDER_SITE_OTHER): Payer: BC Managed Care – PPO

## 2023-04-02 DIAGNOSIS — J4550 Severe persistent asthma, uncomplicated: Secondary | ICD-10-CM | POA: Diagnosis not present

## 2023-04-23 ENCOUNTER — Other Ambulatory Visit: Payer: Self-pay

## 2023-04-23 ENCOUNTER — Encounter: Payer: Self-pay | Admitting: Family Medicine

## 2023-04-23 ENCOUNTER — Ambulatory Visit: Payer: BC Managed Care – PPO | Admitting: Family Medicine

## 2023-04-23 VITALS — BP 118/76 | HR 95 | Temp 98.0°F | Resp 18 | Ht 66.0 in | Wt 240.5 lb

## 2023-04-23 DIAGNOSIS — K219 Gastro-esophageal reflux disease without esophagitis: Secondary | ICD-10-CM | POA: Diagnosis not present

## 2023-04-23 DIAGNOSIS — J302 Other seasonal allergic rhinitis: Secondary | ICD-10-CM

## 2023-04-23 DIAGNOSIS — J4550 Severe persistent asthma, uncomplicated: Secondary | ICD-10-CM | POA: Diagnosis not present

## 2023-04-23 DIAGNOSIS — H1013 Acute atopic conjunctivitis, bilateral: Secondary | ICD-10-CM

## 2023-04-23 DIAGNOSIS — J3089 Other allergic rhinitis: Secondary | ICD-10-CM

## 2023-04-23 DIAGNOSIS — H6993 Unspecified Eustachian tube disorder, bilateral: Secondary | ICD-10-CM

## 2023-04-23 DIAGNOSIS — H1010 Acute atopic conjunctivitis, unspecified eye: Secondary | ICD-10-CM

## 2023-04-23 MED ORDER — CARBINOXAMINE MALEATE 4 MG PO TABS: 2.0000 | ORAL_TABLET | Freq: Two times a day (BID) | ORAL | 1 refills | Status: DC | PRN

## 2023-04-23 MED ORDER — XHANCE 93 MCG/ACT NA EXHU: 1.0000 | INHALANT_SUSPENSION | Freq: Two times a day (BID) | NASAL | 5 refills | Status: DC | PRN

## 2023-04-23 MED ORDER — ALBUTEROL SULFATE HFA 108 (90 BASE) MCG/ACT IN AERS: 2.0000 | INHALATION_SPRAY | RESPIRATORY_TRACT | 1 refills | Status: DC | PRN

## 2023-04-23 MED ORDER — OLOPATADINE HCL 0.2 % OP SOLN: 1.0000 [drp] | Freq: Every day | OPHTHALMIC | 3 refills | Status: DC | PRN

## 2023-04-23 MED ORDER — PANTOPRAZOLE SODIUM 40 MG PO TBEC: 40.0000 mg | DELAYED_RELEASE_TABLET | Freq: Every day | ORAL | 1 refills | Status: DC

## 2023-04-23 MED ORDER — BREZTRI AEROSPHERE 160-9-4.8 MCG/ACT IN AERO: 2.0000 | INHALATION_SPRAY | Freq: Two times a day (BID) | RESPIRATORY_TRACT | 3 refills | Status: DC

## 2023-04-23 NOTE — Patient Instructions (Addendum)
Asthma Begin Breztri 2 puffs twice a day with a spacer to prevent cough or wheeze. This will replace trelegy for now Continue albuterol 2 puffs once every 4 hours as needed for cough or wheeze You may use albuterol 2 puffs 5 to 15 minutes before activity to decrease cough or wheeze If no improvement with your breathing, we will move forward with chest CT scan  Allergic rhinitis Continue allergen avoidance measures directed toward pollen, mold, pets, and cockroach as listed below Continue carbinoxamine 4 mg tablets. Take 2 tablets twice a day as needed for nasal symptoms  Continue Xhance nasal spray 2 sprays in each nostril twice a day for nasal symptoms Continue saline nasal rinses as needed for nasal symptoms. Use this before any medicated nasal sprays for best result Continue allergen immunotherapy if your symptoms are not well controlled with the treatment plan as listed above. Call the clinic if you are interested in allergen immunotherapy  Allergic conjunctivitis Consider using a lubricant drop such as Systane or refresh as needed Some over the counter eye drops include Pataday one drop in each eye once a day as needed for red, itchy eyes OR Zaditor one drop in each eye twice a day as needed for red itchy eyes.  Recurrent sinusitis Keep track of infections, antibiotic, and steroid use  Reflux Continue dietary and lifestyle modifications as listed below Continue pantoprazole 40 mg once a day as previously prescribed. Continue to take this medication 30 minutes before your first meal for best results.   Eustachian tube dysfunction Continue nasal saline rinses followed by Timmothy Sours twice a day  Call the clinic if this treatment plan is not working well for you.  Follow up in 3 months or sooner if needed.  Reducing Pollen Exposure The American Academy of Allergy, Asthma and Immunology suggests the following steps to reduce your exposure to pollen during allergy seasons. Do not hang  sheets or clothing out to dry; pollen may collect on these items. Do not mow lawns or spend time around freshly cut grass; mowing stirs up pollen. Keep windows closed at night.  Keep car windows closed while driving. Minimize morning activities outdoors, a time when pollen counts are usually at their highest. Stay indoors as much as possible when pollen counts or humidity is high and on windy days when pollen tends to remain in the air longer. Use air conditioning when possible.  Many air conditioners have filters that trap the pollen spores. Use a HEPA room air filter to remove pollen form the indoor air you breathe.  Control of Mold Allergen Mold and fungi can grow on a variety of surfaces provided certain temperature and moisture conditions exist.  Outdoor molds grow on plants, decaying vegetation and soil.  The major outdoor mold, Alternaria and Cladosporium, are found in very high numbers during hot and dry conditions.  Generally, a late Summer - Fall peak is seen for common outdoor fungal spores.  Rain will temporarily lower outdoor mold spore count, but counts rise rapidly when the rainy period ends.  The most important indoor molds are Aspergillus and Penicillium.  Dark, humid and poorly ventilated basements are ideal sites for mold growth.  The next most common sites of mold growth are the bathroom and the kitchen.  Outdoor [employer] Use air conditioning and keep windows closed Avoid exposure to decaying vegetation. Avoid leaf raking. Avoid grain handling. Consider wearing a face mask if working in moldy areas.  Indoor Mold Control Maintain humidity below 50%.  Clean washable surfaces with 5% bleach solution. Remove sources e.g. Contaminated carpets. Control of Dog or Cat Allergen Avoidance is the best way to manage a dog or cat allergy. If you have a dog or cat and are allergic to dog or cats, consider removing the dog or cat from the home. If you have a dog or cat but don't want  to find it a new home, or if your family wants a pet even though someone in the household is allergic, here are some strategies that may help keep symptoms at bay:  Keep the pet out of your bedroom and restrict it to only a few rooms. Be advised that keeping the dog or cat in only one room will not limit the allergens to that room. Don't pet, hug or kiss the dog or cat; if you do, wash your hands with soap and water. High-efficiency particulate air (HEPA) cleaners run continuously in a bedroom or living room can reduce allergen levels over time. Regular use of a high-efficiency vacuum cleaner or a central vacuum can reduce allergen levels. Giving your dog or cat a bath at least once a week can reduce airborne allergen.  Control of Cockroach Allergen Cockroach allergen has been identified as an important cause of acute attacks of asthma, especially in urban settings.  There are fifty-five species of cockroach that exist in the [place], however only three, the [place], [place] species produce allergen that can affect patients with Asthma.  Allergens can be obtained from fecal particles, egg casings and secretions from cockroaches.    Remove food sources. Reduce access to water. Seal access and entry points. Spray runways with 0.5-1% Diazinon or Chlorpyrifos Blow boric acid power under stoves and refrigerator. Place bait stations (hydramethylnon) at feeding sites.    Lifestyle Changes for Controlling GERD When you have GERD, stomach acid feels as if it's backing up toward your mouth. Whether or not you take medication to control your GERD, your symptoms can often be improved with lifestyle changes.   Raise Your Head Reflux is more likely to strike when you're lying down flat, because stomach fluid can flow backward more easily. Raising the head of your bed 4-6 inches can help. To do this: Slide blocks or books under the legs at the head of your bed. Or, place a wedge  under the mattress. Many foam stores can make a suitable wedge for you. The wedge should run from your waist to the top of your head. Don't just prop your head on several pillows. This increases pressure on your stomach. It can make GERD worse.  Watch Your Eating Habits Certain foods may increase the acid in your stomach or relax the lower esophageal sphincter, making GERD more likely. It's best to avoid the following: Coffee, tea, and carbonated drinks (with and without caffeine) Fatty, fried, or spicy food Mint, chocolate, onions, and tomatoes Any other foods that seem to irritate your stomach or cause you pain  Relieve the Pressure Eat smaller meals, even if you have to eat more often. Don't lie down right after you eat. Wait a few hours for your stomach to empty. Avoid tight belts and tight-fitting clothes. Lose excess weight.  Tobacco and Alcohol Avoid smoking tobacco and drinking alcohol. They can make GERD symptoms worse.

## 2023-04-23 NOTE — Progress Notes (Unsigned)
   [address] [place] [address] Dept: [phone]  FOLLOW UP NOTE  Patient ID: Elizabeth Frank, female    DOB: [birth year]  Age: 32 y.o. MRN: [id number] Date of Office Visit: 04/23/2023  Assessment  Chief Complaint: No chief complaint on file.  HPI Elizabeth Frank is a 32 year old female who presents to the clinic for follow-up visit.  She was last seen in this clinic on 11/16/2022 by Dr. Dellis Anes for evaluation of asthma with acute exacerbation requiring a prednisone taper for relief of symptoms and allergic rhinitis.  Her last environmental allergy skin testing was on 06/03/2022 and was positive to ragweed pollen, tree pollen, mold, cat, dog, and cockroach.   Drug Allergies:  No Known Allergies  Physical Exam: There were no vitals taken for this visit.   Physical Exam  Diagnostics: FVC see 3.11 which is 76% of predicted value, FEV1 one 2.14 which is 62.9% of predicted value.  Spirometry indicates mixed defect with moderate airway obstruction.  Postbronchodilator FVC 3.11, FEV1 2.14.  Postbronchodilator spirometry indicates possible restriction with 3.74% increase in FEV1.  Assessment and Plan: No diagnosis found.  No orders of the defined types were placed in this encounter.   There are no Patient Instructions on file for this visit.  No follow-ups on file.    Thank you for the opportunity to care for this patient.  Please do not hesitate to contact me with questions.  Thermon Leyland, FNP [facility]

## 2023-04-25 ENCOUNTER — Encounter: Payer: Self-pay | Admitting: Family Medicine

## 2023-04-25 DIAGNOSIS — H6993 Unspecified Eustachian tube disorder, bilateral: Secondary | ICD-10-CM | POA: Insufficient documentation

## 2023-04-25 DIAGNOSIS — J4550 Severe persistent asthma, uncomplicated: Secondary | ICD-10-CM | POA: Insufficient documentation

## 2023-05-31 ENCOUNTER — Ambulatory Visit (INDEPENDENT_AMBULATORY_CARE_PROVIDER_SITE_OTHER): Payer: BC Managed Care – PPO

## 2023-05-31 DIAGNOSIS — J4530 Mild persistent asthma, uncomplicated: Secondary | ICD-10-CM | POA: Diagnosis not present

## 2023-07-23 ENCOUNTER — Ambulatory Visit: Payer: BC Managed Care – PPO | Admitting: Family Medicine

## 2023-07-26 ENCOUNTER — Ambulatory Visit (INDEPENDENT_AMBULATORY_CARE_PROVIDER_SITE_OTHER): Payer: BC Managed Care – PPO

## 2023-07-26 DIAGNOSIS — J4540 Moderate persistent asthma, uncomplicated: Secondary | ICD-10-CM

## 2023-09-20 ENCOUNTER — Ambulatory Visit: Payer: BC Managed Care – PPO

## 2023-09-24 ENCOUNTER — Ambulatory Visit: Payer: BC Managed Care – PPO

## 2023-09-24 DIAGNOSIS — J4530 Mild persistent asthma, uncomplicated: Secondary | ICD-10-CM | POA: Diagnosis not present

## 2023-11-22 ENCOUNTER — Ambulatory Visit: Payer: BC Managed Care – PPO

## 2023-11-22 ENCOUNTER — Encounter: Payer: Self-pay | Admitting: Internal Medicine

## 2023-11-22 ENCOUNTER — Ambulatory Visit: Payer: BC Managed Care – PPO | Admitting: Internal Medicine

## 2023-11-22 VITALS — BP 130/82 | HR 91 | Temp 97.9°F | Resp 18 | Ht 66.0 in | Wt 255.2 lb

## 2023-11-22 DIAGNOSIS — J302 Other seasonal allergic rhinitis: Secondary | ICD-10-CM

## 2023-11-22 DIAGNOSIS — J3089 Other allergic rhinitis: Secondary | ICD-10-CM

## 2023-11-22 DIAGNOSIS — K219 Gastro-esophageal reflux disease without esophagitis: Secondary | ICD-10-CM

## 2023-11-22 DIAGNOSIS — J4540 Moderate persistent asthma, uncomplicated: Secondary | ICD-10-CM

## 2023-11-22 MED ORDER — MOMETASONE FUROATE 50 MCG/ACT NA SUSP: 2.0000 | Freq: Every day | NASAL | 5 refills | Status: AC

## 2023-11-22 MED ORDER — PANTOPRAZOLE SODIUM 40 MG PO TBEC: 40.0000 mg | DELAYED_RELEASE_TABLET | Freq: Every day | ORAL | 1 refills | Status: DC

## 2023-11-22 MED ORDER — BREZTRI AEROSPHERE 160-9-4.8 MCG/ACT IN AERO: 2.0000 | INHALATION_SPRAY | Freq: Two times a day (BID) | RESPIRATORY_TRACT | 5 refills | Status: DC

## 2023-11-22 MED ORDER — ALBUTEROL SULFATE HFA 108 (90 BASE) MCG/ACT IN AERS: 2.0000 | INHALATION_SPRAY | RESPIRATORY_TRACT | 1 refills | Status: DC | PRN

## 2023-11-22 MED ORDER — CARBINOXAMINE MALEATE 4 MG PO TABS: 1.0000 | ORAL_TABLET | Freq: Two times a day (BID) | ORAL | 1 refills | Status: AC | PRN

## 2023-11-22 MED ORDER — XHANCE 93 MCG/ACT NA EXHU: 1.0000 | INHALANT_SUSPENSION | Freq: Two times a day (BID) | NASAL | 5 refills | Status: DC | PRN

## 2023-11-22 NOTE — Patient Instructions (Addendum)
Moderate Persistent Asthma  - Maintenance inhaler: continue Breztri 2 puffs twice daily with spacer.  Continue Fasenra 30mg  every 8 weeks.  - Rescue inhaler: Albuterol 2 puffs via spacer or 1 vial via nebulizer every 4-6 hours as needed for respiratory symptoms of cough, shortness of breath, or wheezing Asthma control goals:  Full participation in all desired activities (may need albuterol before activity) Albuterol use two times or less a week on average (not counting use with activity) Cough interfering with sleep two times or less a month Oral steroids no more than once a year No hospitalizations  Allergic rhinitis Eustachian tube dysfunction - Continue allergen avoidance measures directed toward pollen, mold, pets, and cockroach as listed below - Continue Carbinoxamine 4 mg tablets twice daily as needed.  - Continue Xhance nasal spray 2 sprays in each nostril twice a day for nasal symptoms.  If Timmothy Sours is not covered, start Nasonex 2 sprays each nostril daily.  - Continue saline nasal rinses as needed for nasal symptoms. Use this before any medicated nasal sprays for best result - Continue allergen immunotherapy if your symptoms are not well controlled with the treatment plan as listed above. Call the clinic if you are interested in allergen immunotherapy  Recurrent sinusitis - Keep track of infections, antibiotic, and steroid use  Reflux - Continue dietary and lifestyle modifications as listed below - Continue Pantoprazole 40 mg once a day as previously prescribed. Continue to take this medication 30 minutes before your first meal for best results.  -Avoid lying down for at least two hours after a meal or after drinking acidic beverages, like soda, or other caffeinated beverages. This can help to prevent stomach contents from flowing back into the esophagus. -Keep your head elevated while you sleep. Using an extra pillow or two can also help to prevent reflux. -Eat smaller and more  frequent meals each day instead of a few large meals. This promotes digestion and can aid in preventing heartburn. -Wear loose-fitting clothes to ease pressure on the stomach, which can worsen heartburn and reflux. -Reduce excess weight around the midsection. This can ease pressure on the stomach. Such pressure can force some stomach contents back up the esophagus.

## 2023-11-22 NOTE — Progress Notes (Signed)
FOLLOW UP Date of Service/Encounter:  11/22/23   Subjective:  Elizabeth Frank (DOB: 23-Feb-1991) is a 32 y.o. female who [facility] for follow up for asthma, allergic rhinitis/ET dysfunction, GERD.   History obtained from: chart review and patient. Last visit was with Thermon Leyland 04/23/2023 and at the time was having trouble with her asthma with frequent SOB/cough.  Discussed continuation of Harrington Challenger, starting Breztri instead of Trelegy.  Also on Kathlen Brunswick for allergies.  Using PPI for GERD.  Since last visit, reports she has not had any asthma/allergy meds for couple of months due to mental health issues and not getting her paycheck.  She is planning on going to the pharmacy ASAP and getting her refills.  Has had trouble with frequent cough and shortness of breath. Also has anxiety which makes it hard for her to tell her asthma symptoms form that.  She does note she was feeling better when she was taking her medications.  No ER visits/oral prednisone use though.  GERD is controlled as long as she takes the PPI.    Past Medical History: Past Medical History:  Diagnosis Date   ADHD    Allergic rhinitis    Anxiety    Asthma    Frequent headaches    Hypertension    Recurrent sinusitis     Objective:  BP 130/82   Pulse 91   Temp 97.9 F (36.6 C)   Resp 18   Ht 5\' 6"  (1.676 m)   Wt 255 lb 3.2 oz (115.8 kg)   SpO2 96%   BMI 41.19 kg/m  Body mass index is 41.19 kg/m. Physical Exam: GEN: alert, well developed HEENT: clear conjunctiva, nose with mild inferior turbinate hypertrophy, pink nasal mucosa, clear rhinorrhea, no cobblestoning HEART: regular rate and rhythm, no murmur LUNGS: clear to auscultation bilaterally, no coughing, unlabored respiration SKIN: no rashes or lesions  Spirometry:  Tracings reviewed. Her effort: Good reproducible efforts. FVC: 3.19L, 79% predicted  FEV1: 2.31L, 68% predicted FEV1/FVC ratio:  72% Interpretation: Spirometry consistent with normal pattern.  Please see scanned spirometry results for details.  Assessment:   1. Seasonal and perennial allergic rhinitis   2. Moderate persistent asthma without complication   3. Gastroesophageal reflux disease, unspecified whether esophagitis present     Plan/Recommendations:    Severe Persistent Asthma  - MDI technique discussed. Spirometry today without obstruction but with frequent symptoms. Uncontrolled due to being off medications.  MDI technique discussed   - Maintenance inhaler: restart Breztri 2 puffs twice daily with spacer.  Continue Fasenra 30mg  every 8 weeks.  - Rescue inhaler: Albuterol 2 puffs via spacer or 1 vial via nebulizer every 4-6 hours as needed for respiratory symptoms of cough, shortness of breath, or wheezing Asthma control goals:  Full participation in all desired activities (may need albuterol before activity) Albuterol use two times or less a week on average (not counting use with activity) Cough interfering with sleep two times or less a month Oral steroids no more than once a year No hospitalizations  Allergic rhinitis Eustachian tube dysfunction - Uncontrolled, discussed restarting medications  - Continue allergen avoidance measures directed toward pollen, mold, pets, and cockroach as listed below - Continue Carbinoxamine 4 mg tablets twice daily as needed.  - Restart Xhance nasal spray 2 sprays in each nostril twice a day for nasal symptoms.  If Timmothy Sours is not covered, start Nasonex 2 sprays each nostril daily.  - Continue saline  nasal rinses as needed for nasal symptoms. Use this before any medicated nasal sprays for best result - Continue allergen immunotherapy if your symptoms are not well controlled with the treatment plan as listed above. Call the clinic if you are interested in allergen immunotherapy  Recurrent sinusitis - Keep track of infections, antibiotic, and steroid use  Reflux -  Continue dietary and lifestyle modifications as listed below - Continue Pantoprazole 40 mg once a day as previously prescribed. Continue to take this medication 30 minutes before your first meal for best results.  -Avoid lying down for at least two hours after a meal or after drinking acidic beverages, like soda, or other caffeinated beverages. This can help to prevent stomach contents from flowing back into the esophagus. -Keep your head elevated while you sleep. Using an extra pillow or two can also help to prevent reflux. -Eat smaller and more frequent meals each day instead of a few large meals. This promotes digestion and can aid in preventing heartburn. -Wear loose-fitting clothes to ease pressure on the stomach, which can worsen heartburn and reflux. -Reduce excess weight around the midsection. This can ease pressure on the stomach. Such pressure can force some stomach contents back up the esophagus.      Return in about 3 months (around 02/20/2024).  Alesia Morin, MD [facility]

## 2023-11-24 NOTE — Addendum Note (Signed)
Addended by: Elsworth Soho on: 11/24/2023 05:42 PM   Modules accepted: Orders

## 2023-11-29 ENCOUNTER — Telehealth: Payer: BC Managed Care – PPO | Admitting: Physician Assistant

## 2023-11-29 DIAGNOSIS — B9689 Other specified bacterial agents as the cause of diseases classified elsewhere: Secondary | ICD-10-CM

## 2023-11-29 DIAGNOSIS — J069 Acute upper respiratory infection, unspecified: Secondary | ICD-10-CM

## 2023-11-29 MED ORDER — AMOXICILLIN-POT CLAVULANATE 875-125 MG PO TABS: 1.0000 | ORAL_TABLET | Freq: Two times a day (BID) | ORAL | 0 refills | Status: DC

## 2023-11-29 MED ORDER — PREDNISONE 20 MG PO TABS: 40.0000 mg | ORAL_TABLET | Freq: Every day | ORAL | 0 refills | Status: DC

## 2023-11-29 MED ORDER — PROMETHAZINE-DM 6.25-15 MG/5ML PO SYRP: 5.0000 mL | ORAL_SOLUTION | Freq: Four times a day (QID) | ORAL | 0 refills | Status: DC | PRN

## 2023-11-29 NOTE — Patient Instructions (Signed)
Elizabeth Frank, thank you for joining Margaretann Loveless, PA-C for today's virtual visit.  While this provider is not your primary care provider (PCP), if your PCP is located in our provider database this encounter information will be shared with them immediately following your visit.   A [facility] MyChart account gives you access to today's visit and all your visits, tests, and labs performed at [facility] " click here if you don't have a [facility] MyChart account or go to [email or web address]  Consent: (Patient) Elizabeth Frank provided verbal consent for this virtual visit at the beginning of the encounter.  Current Medications:  Current Outpatient Medications:    amoxicillin-clavulanate (AUGMENTIN) 875-125 MG tablet, Take 1 tablet by mouth 2 (two) times daily., Disp: 20 tablet, Rfl: 0   predniSONE (DELTASONE) 20 MG tablet, Take 2 tablets (40 mg total) by mouth daily with breakfast., Disp: 10 tablet, Rfl: 0   promethazine-dextromethorphan (PROMETHAZINE-DM) 6.25-15 MG/5ML syrup, Take 5 mLs by mouth 4 (four) times daily as needed., Disp: 118 mL, Rfl: 0   albuterol (VENTOLIN HFA) 108 (90 Base) MCG/ACT inhaler, Inhale 2 puffs into the lungs every 4 (four) hours as needed for wheezing or shortness of breath., Disp: 18 g, Rfl: 1   amphetamine-dextroamphetamine (ADDERALL XR) 30 MG 24 hr capsule, Take 30 mg by mouth every morning., Disp: , Rfl:    Benralizumab (FASENRA PEN) 30 MG/ML SOAJ, Inject 1 mL (30 mg total) into the skin every 28 (twenty-eight) days., Disp: 1 mL, Rfl: 11   BREZTRI AEROSPHERE 160-9-4.8 MCG/ACT AERO, Inhale 2 puffs into the lungs in the morning and at bedtime., Disp: 10.7 g, Rfl: 5   Carbinoxamine Maleate 4 MG TABS, Take 1 tablet (4 mg total) by mouth 2 (two) times daily as needed (allergies)., Disp: 190 tablet, Rfl: 1   carvedilol (COREG) 6.25 MG tablet, Take 6.25 mg by mouth 2 (two) times daily., Disp: , Rfl:    FLUoxetine (PROZAC) 40 MG capsule, Take 80  mg by mouth daily., Disp: , Rfl:    gabapentin (NEURONTIN) 300 MG capsule, Take 300 mg by mouth 3 (three) times daily., Disp: , Rfl:    guanFACINE (INTUNIV) 1 MG TB24 ER tablet, Take 1 mg by mouth at bedtime., Disp: , Rfl:    mometasone (NASONEX) 50 MCG/ACT nasal spray, Place 2 sprays into the nose daily., Disp: 1 each, Rfl: 5   Olopatadine HCl (PATADAY) 0.2 % SOLN, Place 1 drop into both eyes daily as needed., Disp: 2.5 mL, Rfl: 3   pantoprazole (PROTONIX) 40 MG tablet, Take 1 tablet (40 mg total) by mouth daily., Disp: 90 tablet, Rfl: 1   topiramate (TOPAMAX) 25 MG tablet, Take 75 mg by mouth daily., Disp: , Rfl:    traZODone (DESYREL) 50 MG tablet, Take 50 mg by mouth at bedtime as needed., Disp: , Rfl:    VYVANSE 70 MG capsule, Take 70 mg by mouth every morning. (Patient not taking: Reported on 11/22/2023), Disp: , Rfl:    XHANCE 93 MCG/ACT EXHU, Place 1-2 sprays into the nose 2 (two) times daily as needed., Disp: 32 mL, Rfl: 5  Current Facility-Administered Medications:    Benralizumab SOSY 30 mg, 30 mg, Subcutaneous, Q28 days, Alfonse Spruce, MD, 30 mg at 11/22/23 1503   Medications ordered in this encounter:  Meds ordered this encounter  Medications   amoxicillin-clavulanate (AUGMENTIN) 875-125 MG tablet    Sig: Take 1 tablet by mouth 2 (two) times daily.    Dispense:  20 tablet    Refill:  0    Order Specific Question:   Supervising Provider    Answer:   Merrilee Jansky [[id number]]   predniSONE (DELTASONE) 20 MG tablet    Sig: Take 2 tablets (40 mg total) by mouth daily with breakfast.    Dispense:  10 tablet    Refill:  0    Order Specific Question:   Supervising Provider    Answer:   Merrilee Jansky [[id number]]   promethazine-dextromethorphan (PROMETHAZINE-DM) 6.25-15 MG/5ML syrup    Sig: Take 5 mLs by mouth 4 (four) times daily as needed.    Dispense:  118 mL    Refill:  0    Order Specific Question:   Supervising Provider    Answer:   Merrilee Jansky [[id number]]      *If you need refills on other medications prior to your next appointment, please contact your pharmacy*  Follow-Up: Call back or seek an in-person evaluation if the symptoms worsen or if the condition fails to improve as anticipated.  [facility] [phone]  Other Instructions Sinus Infection, Adult A sinus infection, also called sinusitis, is inflammation of your sinuses. Sinuses are hollow spaces in the bones around your face. Your sinuses are located: Around your eyes. In the middle of your forehead. Behind your nose. In your cheekbones. Mucus normally drains out of your sinuses. When your nasal tissues become inflamed or swollen, mucus can become trapped or blocked. This allows bacteria, viruses, and fungi to grow, which leads to infection. Most infections of the sinuses are caused by a virus. A sinus infection can develop quickly. It can last for up to 4 weeks (acute) or for more than 12 weeks (chronic). A sinus infection often develops after a cold. What are the causes? This condition is caused by anything that creates swelling in the sinuses or stops mucus from draining. This includes: Allergies. Asthma. Infection from bacteria or viruses. Deformities or blockages in your nose or sinuses. Abnormal growths in the nose (nasal polyps). Pollutants, such as chemicals or irritants in the air. Infection from fungi. This is rare. What increases the risk? You are more likely to develop this condition if you: Have a weak body defense system (immune system). Do a lot of swimming or diving. Overuse nasal sprays. Smoke. What are the signs or symptoms? The main symptoms of this condition are pain and a feeling of pressure around the affected sinuses. Other symptoms include: Stuffy nose or congestion that makes it difficult to breathe through your nose. Thick yellow or greenish drainage from your nose. Tenderness, swelling, and warmth over the affected sinuses. A  cough that may get worse at night. Decreased sense of smell and taste. Extra mucus that collects in the throat or the back of the nose (postnasal drip) causing a sore throat or bad breath. Tiredness (fatigue). Fever. How is this diagnosed? This condition is diagnosed based on: Your symptoms. Your medical history. A physical exam. Tests to find out if your condition is acute or chronic. This may include: Checking your nose for nasal polyps. Viewing your sinuses using a device that has a light (endoscope). Testing for allergies or bacteria. Imaging tests, such as an MRI or CT scan. In rare cases, a bone biopsy may be done to rule out more serious types of fungal sinus disease. How is this treated? Treatment for a sinus infection depends on the cause and whether your condition is chronic or acute. If caused  by a virus, your symptoms should go away on their own within 10 days. You may be given medicines to relieve symptoms. They include: Medicines that shrink swollen nasal passages (decongestants). A spray that eases inflammation of the nostrils (topical intranasal corticosteroids). Rinses that help get rid of thick mucus in your nose (nasal saline washes). Medicines that treat allergies (antihistamines). Over-the-counter pain relievers. If caused by bacteria, your health care provider may recommend waiting to see if your symptoms improve. Most bacterial infections will get better without antibiotic medicine. You may be given antibiotics if you have: A severe infection. A weak immune system. If caused by narrow nasal passages or nasal polyps, surgery may be needed. Follow these instructions at home: Medicines Take, use, or apply over-the-counter and prescription medicines only as told by your health care provider. These may include nasal sprays. If you were prescribed an antibiotic medicine, take it as told by your health care provider. Do not stop taking the antibiotic even if you start  to feel better. Hydrate and humidify  Drink enough fluid to keep your urine pale yellow. Staying hydrated will help to thin your mucus. Use a cool mist humidifier to keep the humidity level in your home above 50%. Inhale steam for 10-15 minutes, 3-4 times a day, or as told by your health care provider. You can do this in the bathroom while a hot shower is running. Limit your exposure to cool or dry air. Rest Rest as much as possible. Sleep with your head raised (elevated). Make sure you get enough sleep each night. General instructions  Apply a warm, moist washcloth to your face 3-4 times a day or as told by your health care provider. This will help with discomfort. Use nasal saline washes as often as told by your health care provider. Wash your hands often with soap and water to reduce your exposure to germs. If soap and water are not available, use hand sanitizer. Do not smoke. Avoid being around people who are smoking (secondhand smoke). Keep all follow-up visits. This is important. Contact a health care provider if: You have a fever. Your symptoms get worse. Your symptoms do not improve within 10 days. Get help right away if: You have a severe headache. You have persistent vomiting. You have severe pain or swelling around your face or eyes. You have vision problems. You develop confusion. Your neck is stiff. You have trouble breathing. These symptoms may be an emergency. Get help right away. Call 911. Do not wait to see if the symptoms will go away. Do not drive yourself to the hospital. Summary A sinus infection is soreness and inflammation of your sinuses. Sinuses are hollow spaces in the bones around your face. This condition is caused by nasal tissues that become inflamed or swollen. The swelling traps or blocks the flow of mucus. This allows bacteria, viruses, and fungi to grow, which leads to infection. If you were prescribed an antibiotic medicine, take it as told by  your health care provider. Do not stop taking the antibiotic even if you start to feel better. Keep all follow-up visits. This is important. This information is not intended to replace advice given to you by your health care provider. Make sure you discuss any questions you have with your health care provider. Document Revised: 11/11/2021 Document Reviewed: 11/11/2021 Elsevier Patient Education  2024 [employer].    If you have been instructed to have an in-person evaluation today at a local Urgent Care facility, please use  the link below. It will take you to a list of all of our available [facility], including address, phone number and hours of operation. Please do not delay care.  [facility]  If you or a family member do not have a primary care provider, use the link below to schedule a visit and establish care. When you choose a [facility] primary care physician or advanced practice provider, you gain a long-term partner in health. Find a Primary Care Provider  Learn more about [facility]'s in-office and virtual care options: [facility] - Get Care Now

## 2023-11-29 NOTE — Progress Notes (Signed)
Virtual Visit Consent   Elizabeth Frank, you are scheduled for a virtual visit with a [facility] provider today. Just as with appointments in the office, your consent must be obtained to participate. Your consent will be active for this visit and any virtual visit you may have with one of our providers in the next 365 days. If you have a MyChart account, a copy of this consent can be sent to you electronically.  As this is a virtual visit, video technology does not allow for your provider to perform a traditional examination. This may limit your provider's ability to fully assess your condition. If your provider identifies any concerns that need to be evaluated in person or the need to arrange testing (such as labs, EKG, etc.), we will make arrangements to do so. Although advances in technology are sophisticated, we cannot ensure that it will always work on either your end or our end. If the connection with a video visit is poor, the visit may have to be switched to a telephone visit. With either a video or telephone visit, we are not always able to ensure that we have a secure connection.  By engaging in this virtual visit, you consent to the provision of healthcare and authorize for your insurance to be billed (if applicable) for the services provided during this visit. Depending on your insurance coverage, you may receive a charge related to this service.  I need to obtain your verbal consent now. Are you willing to proceed with your visit today? Elizabeth Frank has provided verbal consent on 11/29/2023 for a virtual visit (video or telephone). Margaretann Loveless, PA-C  Date: 11/29/2023 4:03 PM  Virtual Visit via Video Note   I, Margaretann Loveless, connected with  Elizabeth Frank  ([id number], 07-16-1991) on 11/29/23 at  4:00 PM EST by a video-enabled telemedicine application and verified that I am speaking with the correct person using two identifiers.  Location: Patient: Virtual Visit Location  Patient: Home Provider: Virtual Visit Location Provider: Home Office   I discussed the limitations of evaluation and management by telemedicine and the availability of in person appointments. The patient expressed understanding and agreed to proceed.    History of Present Illness: Elizabeth Frank is a 32 y.o. who identifies as a female who was assigned female at birth, and is being seen today for congestion and cough.  HPI: Sinusitis This is a new problem. The current episode started 1 to 4 weeks ago. The problem has been gradually worsening since onset. There has been no fever. The pain is moderate. Associated symptoms include congestion, coughing, ear pain (had muffled feeling for 1 day), headaches, sinus pressure and a sore throat. Pertinent negatives include no chills or diaphoresis. (Mornings are worst ) Treatments tried: OTC cold and flu medication. The treatment provided no relief.     Problems:  Patient Active Problem List   Diagnosis Date Noted   Not well controlled severe persistent asthma 04/25/2023   Dysfunction of both eustachian tubes 04/25/2023   Moderate persistent asthma without complication 07/17/2022   Seasonal and perennial allergic rhinitis 07/17/2022   Recurrent infections 07/17/2022   Gastroesophageal reflux disease 07/17/2022   Seasonal allergic conjunctivitis 07/17/2022   Encounter for gynecological examination with Papanicolaou smear of cervix 05/20/2021   Elevated BP without diagnosis of hypertension 05/20/2021   History of abnormal Pap smear 04/17/2013    Allergies: No Known Allergies Medications:  Current Outpatient Medications:    amoxicillin-clavulanate (AUGMENTIN) 875-125  MG tablet, Take 1 tablet by mouth 2 (two) times daily., Disp: 20 tablet, Rfl: 0   predniSONE (DELTASONE) 20 MG tablet, Take 2 tablets (40 mg total) by mouth daily with breakfast., Disp: 10 tablet, Rfl: 0   promethazine-dextromethorphan (PROMETHAZINE-DM) 6.25-15 MG/5ML syrup, Take 5 mLs  by mouth 4 (four) times daily as needed., Disp: 118 mL, Rfl: 0   albuterol (VENTOLIN HFA) 108 (90 Base) MCG/ACT inhaler, Inhale 2 puffs into the lungs every 4 (four) hours as needed for wheezing or shortness of breath., Disp: 18 g, Rfl: 1   amphetamine-dextroamphetamine (ADDERALL XR) 30 MG 24 hr capsule, Take 30 mg by mouth every morning., Disp: , Rfl:    Benralizumab (FASENRA PEN) 30 MG/ML SOAJ, Inject 1 mL (30 mg total) into the skin every 28 (twenty-eight) days., Disp: 1 mL, Rfl: 11   BREZTRI AEROSPHERE 160-9-4.8 MCG/ACT AERO, Inhale 2 puffs into the lungs in the morning and at bedtime., Disp: 10.7 g, Rfl: 5   Carbinoxamine Maleate 4 MG TABS, Take 1 tablet (4 mg total) by mouth 2 (two) times daily as needed (allergies)., Disp: 190 tablet, Rfl: 1   carvedilol (COREG) 6.25 MG tablet, Take 6.25 mg by mouth 2 (two) times daily., Disp: , Rfl:    FLUoxetine (PROZAC) 40 MG capsule, Take 80 mg by mouth daily., Disp: , Rfl:    gabapentin (NEURONTIN) 300 MG capsule, Take 300 mg by mouth 3 (three) times daily., Disp: , Rfl:    guanFACINE (INTUNIV) 1 MG TB24 ER tablet, Take 1 mg by mouth at bedtime., Disp: , Rfl:    mometasone (NASONEX) 50 MCG/ACT nasal spray, Place 2 sprays into the nose daily., Disp: 1 each, Rfl: 5   Olopatadine HCl (PATADAY) 0.2 % SOLN, Place 1 drop into both eyes daily as needed., Disp: 2.5 mL, Rfl: 3   pantoprazole (PROTONIX) 40 MG tablet, Take 1 tablet (40 mg total) by mouth daily., Disp: 90 tablet, Rfl: 1   topiramate (TOPAMAX) 25 MG tablet, Take 75 mg by mouth daily., Disp: , Rfl:    traZODone (DESYREL) 50 MG tablet, Take 50 mg by mouth at bedtime as needed., Disp: , Rfl:    VYVANSE 70 MG capsule, Take 70 mg by mouth every morning. (Patient not taking: Reported on 11/22/2023), Disp: , Rfl:    XHANCE 93 MCG/ACT EXHU, Place 1-2 sprays into the nose 2 (two) times daily as needed., Disp: 32 mL, Rfl: 5  Current Facility-Administered Medications:    Benralizumab SOSY 30 mg, 30 mg,  Subcutaneous, Q28 days, Alfonse Spruce, MD, 30 mg at 11/22/23 1503  Observations/Objective: Patient is well-developed, well-nourished in no acute distress.  Resting comfortably at home.  Head is normocephalic, atraumatic.  No labored breathing.  Speech is clear and coherent with logical content.  Patient is alert and oriented at baseline.    Assessment and Plan: 1. Bacterial upper respiratory infection - amoxicillin-clavulanate (AUGMENTIN) 875-125 MG tablet; Take 1 tablet by mouth 2 (two) times daily.  Dispense: 20 tablet; Refill: 0 - predniSONE (DELTASONE) 20 MG tablet; Take 2 tablets (40 mg total) by mouth daily with breakfast.  Dispense: 10 tablet; Refill: 0 - promethazine-dextromethorphan (PROMETHAZINE-DM) 6.25-15 MG/5ML syrup; Take 5 mLs by mouth 4 (four) times daily as needed.  Dispense: 118 mL; Refill: 0  - Worsening symptoms that have not responded to OTC medications.  - Will give Augmentin and Prednisone -Promethazine DM for cough - Continue allergy medications.  - Steam and humidifier can help - Saline nasal rinses -  Stay well hydrated and get plenty of rest.  - Seek in person evaluation if no symptom improvement or if symptoms worsen   Follow Up Instructions: I discussed the assessment and treatment plan with the patient. The patient was provided an opportunity to ask questions and all were answered. The patient agreed with the plan and demonstrated an understanding of the instructions.  A copy of instructions were sent to the patient via MyChart unless otherwise noted below.    The patient was advised to call back or seek an in-person evaluation if the symptoms worsen or if the condition fails to improve as anticipated.    Margaretann Loveless, PA-C

## 2024-01-17 ENCOUNTER — Ambulatory Visit: Payer: 59

## 2024-01-24 ENCOUNTER — Ambulatory Visit (INDEPENDENT_AMBULATORY_CARE_PROVIDER_SITE_OTHER): Payer: 59

## 2024-01-24 DIAGNOSIS — J4550 Severe persistent asthma, uncomplicated: Secondary | ICD-10-CM | POA: Diagnosis not present

## 2024-02-16 ENCOUNTER — Other Ambulatory Visit: Payer: Self-pay | Admitting: Allergy & Immunology

## 2024-03-13 ENCOUNTER — Ambulatory Visit: Payer: BC Managed Care – PPO | Admitting: Internal Medicine

## 2024-03-20 ENCOUNTER — Ambulatory Visit (INDEPENDENT_AMBULATORY_CARE_PROVIDER_SITE_OTHER): Payer: 59 | Admitting: *Deleted

## 2024-03-20 DIAGNOSIS — J4550 Severe persistent asthma, uncomplicated: Secondary | ICD-10-CM

## 2024-03-21 NOTE — Progress Notes (Deleted)
   [address] [place] [address] Dept: [phone]  FOLLOW UP NOTE  Patient ID: Elizabeth Frank, female    DOB: 07/18/1991  Age: 33 y.o. MRN: [id number] Date of Office Visit: 03/23/2024  Assessment  Chief Complaint: No chief complaint on file.  HPI Elizabeth Frank is a 33 year old female who presents to the clinic for follow-up visit.  She was last seen in this clinic on 11/22/2023 by Dr. Allena Katz for evaluation of asthma, allergic rhinitis, reflux, and recurrent infection.  Her last environmental allergy skin testing was on 06/03/2022 and was positive to ragweed pollen, tree pollen, mold, cat, dog, and cockroach.  Discussed the use of AI scribe software for clinical note transcription with the patient, who gave verbal consent to proceed.  History of Present Illness      Drug Allergies:  No Known Allergies  Physical Exam: There were no vitals taken for this visit.   Physical Exam  Diagnostics:    Assessment and Plan: No diagnosis found.  No orders of the defined types were placed in this encounter.   There are no Patient Instructions on file for this visit.  No follow-ups on file.    Thank you for the opportunity to care for this patient.  Please do not hesitate to contact me with questions.  Thermon Leyland, FNP [facility]

## 2024-03-23 ENCOUNTER — Ambulatory Visit: Admitting: Family Medicine

## 2024-03-24 ENCOUNTER — Ambulatory Visit (INDEPENDENT_AMBULATORY_CARE_PROVIDER_SITE_OTHER)

## 2024-03-24 ENCOUNTER — Ambulatory Visit (INDEPENDENT_AMBULATORY_CARE_PROVIDER_SITE_OTHER): Payer: Self-pay | Admitting: Family

## 2024-03-24 VITALS — BP 128/86 | HR 78 | Temp 98.4°F | Ht 66.0 in | Wt 244.6 lb

## 2024-03-24 DIAGNOSIS — K219 Gastro-esophageal reflux disease without esophagitis: Secondary | ICD-10-CM

## 2024-03-24 DIAGNOSIS — G43909 Migraine, unspecified, not intractable, without status migrainosus: Secondary | ICD-10-CM | POA: Diagnosis not present

## 2024-03-24 DIAGNOSIS — I10 Essential (primary) hypertension: Secondary | ICD-10-CM

## 2024-03-24 DIAGNOSIS — M25571 Pain in right ankle and joints of right foot: Secondary | ICD-10-CM

## 2024-03-24 DIAGNOSIS — Z7689 Persons encountering health services in other specified circumstances: Secondary | ICD-10-CM

## 2024-03-24 MED ORDER — CARVEDILOL 6.25 MG PO TABS: 6.2500 mg | ORAL_TABLET | Freq: Two times a day (BID) | ORAL | 0 refills | Status: DC

## 2024-03-24 NOTE — Progress Notes (Signed)
 Patient states she fell and never had it looked at. Wants you to look and see if it is okay  Staets anxziety but sees a mental health provider.

## 2024-03-24 NOTE — Progress Notes (Signed)
 Subjective:    Elizabeth Frank - 33 y.o. female MRN [id number]  Date of birth: 1990-12-31  HPI  Elizabeth Frank is to establish care.    Current issues and/or concerns: - Doing well on Carvedilol, no issues/concerns. She does not complain of red flag symptoms such as but not limited to chest pain, shortness of breath, worst headache of life, nausea/vomiting.  - Doing well on Pantoprazole, no issues/concerns.  - Doing well on Topiramate, no issues/concerns.  - States right foot pain persisting for months. Denies recent trauma/injury and red flag symptoms. States right foot began after she slipped on a step. - Established with Psychiatry. She denies thoughts of self-harm, suicidal ideations, homicidal ideations. - No further issues/concerns for discussion today.   ROS per HPI    Health Maintenance:  Health Maintenance Due  Topic Date Due   Pneumococcal Vaccine 41-25 Years old (1 of 2 - PCV) Never done   HIV Screening  Never done   Hepatitis C Screening  Never done   DTaP/Tdap/Td (1 - Tdap) Never done   COVID-19 Vaccine (1 - 2024-25 season) Never done   Cervical Cancer Screening (HPV/Pap Cotest)  05/20/2024     Past Medical History: Patient Active Problem List   Diagnosis Date Noted   Not well controlled severe persistent asthma 04/25/2023   Dysfunction of both eustachian tubes 04/25/2023   Moderate persistent asthma without complication 07/17/2022   Seasonal and perennial allergic rhinitis 07/17/2022   Recurrent infections 07/17/2022   Gastroesophageal reflux disease 07/17/2022   Seasonal allergic conjunctivitis 07/17/2022   Encounter for gynecological examination with Papanicolaou smear of cervix 05/20/2021   Elevated BP without diagnosis of hypertension 05/20/2021   History of abnormal Pap smear 04/17/2013      Social History   reports that she has never smoked. She has never been exposed to tobacco smoke. She has never used smokeless tobacco. She reports current  alcohol use. She reports current drug use. Frequency: 2.00 times per week. Drug: Marijuana.   Family History  family history includes Hypertension in her father and mother.   Medications: reviewed and updated   Objective:   Physical Exam BP 128/86   Pulse 78   Temp 98.4 F (36.9 C) (Oral)   Ht 5\' 6"  (1.676 m)   Wt 244 lb 9.6 oz (110.9 kg)   LMP 03/24/2024   SpO2 97%   BMI 39.48 kg/m   Physical Exam HENT:     Head: Normocephalic and atraumatic.     Nose: Nose normal.     Mouth/Throat:     Mouth: Mucous membranes are moist.     Pharynx: Oropharynx is clear.  Eyes:     Extraocular Movements: Extraocular movements intact.     Conjunctiva/sclera: Conjunctivae normal.     Pupils: Pupils are equal, round, and reactive to light.  Cardiovascular:     Rate and Rhythm: Normal rate and regular rhythm.     Pulses: Normal pulses.     Heart sounds: Normal heart sounds.  Pulmonary:     Effort: Pulmonary effort is normal.     Breath sounds: Normal breath sounds.  Musculoskeletal:        General: Normal range of motion.     Right shoulder: Normal.     Left shoulder: Normal.     Right upper arm: Normal.     Left upper arm: Normal.     Right elbow: Normal.     Left elbow: Normal.     Right  forearm: Normal.     Left forearm: Normal.     Right wrist: Normal.     Left wrist: Normal.     Right hand: Normal.     Left hand: Normal.     Cervical back: Normal, normal range of motion and neck supple.     Thoracic back: Normal.     Lumbar back: Normal.     Right hip: Normal.     Left hip: Normal.     Right upper leg: Normal.     Left upper leg: Normal.     Right knee: Normal.     Left knee: Normal.     Right lower leg: Normal.     Left lower leg: Normal.     Right ankle: Ecchymosis present.     Left ankle: Normal.     Right foot: Normal.     Left foot: Normal.  Neurological:     General: No focal deficit present.     Mental Status: She is alert and oriented to person, place,  and time.  Psychiatric:        Mood and Affect: Mood normal.        Behavior: Behavior normal.        Assessment & Plan:  1. Encounter to establish care (Primary) - Patient presents today to establish care. During the interim follow-up with primary provider as scheduled.  - Return for annual physical examination, labs, and health maintenance. Arrive fasting meaning having no food for at least 8 hours prior to appointment. You may have only water or black coffee. Please take scheduled medications as normal.  2. Primary hypertension - Continue Carvedilol as prescribed.  - Routine screening.  - Counseled on blood pressure goal of less than 130/80, low-sodium, DASH diet, medication compliance, and 150 minutes of moderate intensity exercise per week as tolerated. Counseled on medication adherence and adverse effects. - Follow-up with primary provider in 3 months or sooner if needed. - carvedilol (COREG) 6.25 MG tablet; Take 1 tablet (6.25 mg total) by mouth 2 (two) times daily.  Dispense: 180 tablet; Refill: 0 - Basic Metabolic Panel  3. Gastroesophageal reflux disease, unspecified whether esophagitis present - Continue Pantoprazole as prescribed. Counseled on medication adherence/adverse effects.  - Follow-up with primary provider in 3 months or sooner if needed. - pantoprazole (PROTONIX) 40 MG tablet; Take 1 tablet (40 mg total) by mouth daily.  Dispense: 90 tablet; Refill: 0  4. Migraine without status migrainosus, not intractable, unspecified migraine type - Continue Topiramate as prescribed. Counseled on medication adherence/adverse effects. - Follow-up with primary provider in 3 months or sooner if needed. - topiramate (TOPAMAX) 25 MG tablet; Take 1 tablet (25 mg total) by mouth daily.  Dispense: 30 tablet; Refill: 2  5. Right ankle pain, unspecified chronicity - Diagnostic xray right ankle for further evaluation. - DG Ankle Complete Right; Future    Patient was given clear  instructions to go to Emergency Department or return to medical center if symptoms don't improve, worsen, or new problems develop.The patient verbalized understanding.  I discussed the assessment and treatment plan with the patient. The patient was provided an opportunity to ask questions and all were answered. The patient agreed with the plan and demonstrated an understanding of the instructions.   The patient was advised to call back or seek an in-person evaluation if the symptoms worsen or if the condition fails to improve as anticipated.    Ricky Stabs, NP 03/28/2024, 12:46 PM Primary Care at  [facility]

## 2024-03-25 LAB — BASIC METABOLIC PANEL WITH GFR
BUN/Creatinine Ratio: 16 (ref 9–23)
BUN: 14 mg/dL (ref 6–20)
CO2: 20 mmol/L (ref 20–29)
Calcium: 9 mg/dL (ref 8.7–10.2)
Chloride: 106 mmol/L (ref 96–106)
Creatinine, Ser: 0.85 mg/dL (ref 0.57–1.00)
Glucose: 78 mg/dL (ref 70–99)
Potassium: 3.8 mmol/L (ref 3.5–5.2)
Sodium: 141 mmol/L (ref 134–144)
eGFR: 93 mL/min/{1.73_m2} (ref >59)

## 2024-03-27 ENCOUNTER — Encounter: Payer: Self-pay | Admitting: Family

## 2024-03-27 ENCOUNTER — Other Ambulatory Visit: Payer: Self-pay | Admitting: Family

## 2024-03-27 DIAGNOSIS — M7731 Calcaneal spur, right foot: Secondary | ICD-10-CM

## 2024-03-27 NOTE — Patient Instructions (Incomplete)
 Asthma-not well controlled Your breathing test today shows inflammation Continue Breztri 2 puffs twice a day with a spacer to prevent cough or wheeze. Make sure and take this medication everyday as prescribed. Set reminders on your phone. Sample given Continue albuterol 2 puffs once every 4 hours as needed for cough or wheeze You may use albuterol 2 puffs 5 to 15 minutes before activity to decrease cough or wheeze  Asthma control goals:  Full participation in all desired activities (may need albuterol before activity) Albuterol use two time or less a week on average (not counting use with activity) Cough interfering with sleep two time or less a month Oral steroids no more than once a year No hospitalizations   Allergic rhinitis-not well controlled Continue allergen avoidance measures directed toward pollen, mold, pets, and cockroach as listed below Continue carbinoxamine 4 mg tablets. Take 2 tablets twice a day as needed for nasal symptoms  Continue Nasonex nasal spray 2 sprays in each nostril twice a day for nasal symptoms Continue saline nasal rinses as needed for nasal symptoms. Use this before any medicated nasal sprays for best result Consider  allergen immunotherapy once your asthma gets under better control  Allergic conjunctivitis Consider using a lubricant drop such as Systane or refresh as needed Some over the counter eye drops include Pataday one drop in each eye once a day as needed for red, itchy eyes OR Zaditor one drop in each eye twice a day as needed for red itchy eyes.  Recurrent sinusitis Keep track of infections, antibiotic, and steroid use  Reflux-controlled Continue dietary and lifestyle modifications as listed below Continue pantoprazole 40 mg once a day as previously prescribed. Continue to take this medication 30 minutes before your first meal for best results.   Eustachian tube dysfunction Continue nasal saline rinses followed by Timmothy Sours twice a day  Call  the clinic if this treatment plan is not working well for you.  Follow up in 6 weeks or sooner if needed.  Reducing Pollen Exposure The American Academy of Allergy, Asthma and Immunology suggests the following steps to reduce your exposure to pollen during allergy seasons. Do not hang sheets or clothing out to dry; pollen may collect on these items. Do not mow lawns or spend time around freshly cut grass; mowing stirs up pollen. Keep windows closed at night.  Keep car windows closed while driving. Minimize morning activities outdoors, a time when pollen counts are usually at their highest. Stay indoors as much as possible when pollen counts or humidity is high and on windy days when pollen tends to remain in the air longer. Use air conditioning when possible.  Many air conditioners have filters that trap the pollen spores. Use a HEPA room air filter to remove pollen form the indoor air you breathe.  Control of Mold Allergen Mold and fungi can grow on a variety of surfaces provided certain temperature and moisture conditions exist.  Outdoor molds grow on plants, decaying vegetation and soil.  The major outdoor mold, Alternaria and Cladosporium, are found in very high numbers during hot and dry conditions.  Generally, a late Summer - Fall peak is seen for common outdoor fungal spores.  Rain will temporarily lower outdoor mold spore count, but counts rise rapidly when the rainy period ends.  The most important indoor molds are Aspergillus and Penicillium.  Dark, humid and poorly ventilated basements are ideal sites for mold growth.  The next most common sites of mold growth are the bathroom and  the kitchen.  Outdoor [employer] Use air conditioning and keep windows closed Avoid exposure to decaying vegetation. Avoid leaf raking. Avoid grain handling. Consider wearing a face mask if working in moldy areas.  Indoor Mold Control Maintain humidity below 50%. Clean washable surfaces with 5% bleach  solution. Remove sources e.g. Contaminated carpets. Control of Dog or Cat Allergen Avoidance is the best way to manage a dog or cat allergy. If you have a dog or cat and are allergic to dog or cats, consider removing the dog or cat from the home. If you have a dog or cat but don't want to find it a new home, or if your family wants a pet even though someone in the household is allergic, here are some strategies that may help keep symptoms at bay:  Keep the pet out of your bedroom and restrict it to only a few rooms. Be advised that keeping the dog or cat in only one room will not limit the allergens to that room. Don't pet, hug or kiss the dog or cat; if you do, wash your hands with soap and water. High-efficiency particulate air (HEPA) cleaners run continuously in a bedroom or living room can reduce allergen levels over time. Regular use of a high-efficiency vacuum cleaner or a central vacuum can reduce allergen levels. Giving your dog or cat a bath at least once a week can reduce airborne allergen.  Control of Cockroach Allergen Cockroach allergen has been identified as an important cause of acute attacks of asthma, especially in urban settings.  There are fifty-five species of cockroach that exist in the [place], however only three, the [place], [place] species produce allergen that can affect patients with Asthma.  Allergens can be obtained from fecal particles, egg casings and secretions from cockroaches.    Remove food sources. Reduce access to water. Seal access and entry points. Spray runways with 0.5-1% Diazinon or Chlorpyrifos Blow boric acid power under stoves and refrigerator. Place bait stations (hydramethylnon) at feeding sites.    Lifestyle Changes for Controlling GERD When you have GERD, stomach acid feels as if it's backing up toward your mouth. Whether or not you take medication to control your GERD, your symptoms can often be improved with lifestyle  changes.   Raise Your Head Reflux is more likely to strike when you're lying down flat, because stomach fluid can flow backward more easily. Raising the head of your bed 4-6 inches can help. To do this: Slide blocks or books under the legs at the head of your bed. Or, place a wedge under the mattress. Many foam stores can make a suitable wedge for you. The wedge should run from your waist to the top of your head. Don't just prop your head on several pillows. This increases pressure on your stomach. It can make GERD worse.  Watch Your Eating Habits Certain foods may increase the acid in your stomach or relax the lower esophageal sphincter, making GERD more likely. It's best to avoid the following: Coffee, tea, and carbonated drinks (with and without caffeine) Fatty, fried, or spicy food Mint, chocolate, onions, and tomatoes Any other foods that seem to irritate your stomach or cause you pain  Relieve the Pressure Eat smaller meals, even if you have to eat more often. Don't lie down right after you eat. Wait a few hours for your stomach to empty. Avoid tight belts and tight-fitting clothes. Lose excess weight.  Tobacco and Alcohol Avoid smoking tobacco and drinking  alcohol. They can make GERD symptoms worse.

## 2024-03-27 NOTE — Progress Notes (Signed)
 I called patient no one answered.  I left a voice message to return my call.

## 2024-03-27 NOTE — Progress Notes (Signed)
 I called patient and no on answered.  I left a voicemail for them to return my call.

## 2024-03-28 ENCOUNTER — Telehealth: Payer: Self-pay | Admitting: Family

## 2024-03-28 ENCOUNTER — Encounter: Payer: Self-pay | Admitting: Family

## 2024-03-28 ENCOUNTER — Ambulatory Visit (INDEPENDENT_AMBULATORY_CARE_PROVIDER_SITE_OTHER): Admitting: Family

## 2024-03-28 VITALS — BP 120/82 | HR 66 | Temp 98.0°F | Resp 16

## 2024-03-28 DIAGNOSIS — J302 Other seasonal allergic rhinitis: Secondary | ICD-10-CM

## 2024-03-28 DIAGNOSIS — H1013 Acute atopic conjunctivitis, bilateral: Secondary | ICD-10-CM

## 2024-03-28 DIAGNOSIS — J4550 Severe persistent asthma, uncomplicated: Secondary | ICD-10-CM | POA: Diagnosis not present

## 2024-03-28 DIAGNOSIS — K219 Gastro-esophageal reflux disease without esophagitis: Secondary | ICD-10-CM

## 2024-03-28 DIAGNOSIS — J3089 Other allergic rhinitis: Secondary | ICD-10-CM

## 2024-03-28 DIAGNOSIS — H1010 Acute atopic conjunctivitis, unspecified eye: Secondary | ICD-10-CM

## 2024-03-28 MED ORDER — PANTOPRAZOLE SODIUM 40 MG PO TBEC: 40.0000 mg | DELAYED_RELEASE_TABLET | Freq: Every day | ORAL | 0 refills | Status: DC

## 2024-03-28 MED ORDER — TOPIRAMATE 25 MG PO TABS: 25.0000 mg | ORAL_TABLET | Freq: Every day | ORAL | 2 refills | Status: DC

## 2024-03-28 NOTE — Telephone Encounter (Signed)
 Spoke with pt she states that she is already talking with someone about her mental health.Thank you for checking on her. I also reminded her to use her inhaler as scheduled she agreered . No concerns or questions.

## 2024-03-28 NOTE — Telephone Encounter (Signed)
 Please call Elizabeth Frank and let her know that I recommend that she speak with her primary care physician about finding someone to talk to about her concerns for her mental health.  Nehemiah Settle, FNP

## 2024-03-28 NOTE — Progress Notes (Signed)
 [address] [place] [address] Dept: [phone]  FOLLOW UP NOTE  Patient ID: Elizabeth Frank, female    DOB: [birth year]  Age: 33 y.o. MRN: [id number] Date of Office Visit: 03/28/2024  Assessment  Chief Complaint: Follow-up (Asthma/Allergies /No concerns)  HPI Elizabeth Frank is a 33 year old female who presents today for follow-up of severe persistent asthma, seasonal and perennial allergic rhinitis, eustachian tube dysfunction, recurrent sinusitis, and reflux.  She was last seen on November 22, 2023 by Dr. Allena Katz.  She reports since her last office visit she has been diagnosed with  plantars fasciitis in her right foot.  She already has plantar fasciitis in her left foot.  She also mentions that since her last office visit she did have a recent hospitalization for chest pain, pain down her left arm, and shortness of breath.  She reports that she was cleared from a cardiology standpoint and not sure what caused these symptoms. She did have a chest x-ray on 02/14/24 that showed:" no acute cardiopulmonary disease identified."  Severe persistent asthma: She reports coughing because of postnasal drip and shortness of breath.  She denies wheezing, tightness in her chest, and nocturnal awakenings due to breathing problems.  She has made that 1 trip to the emergency room for chest pain, left arm pain, and shortness of breath since her last office visit.  She does not think that she was given any steroids since her last office visit.  She mentions that she is very forgetful about taking her Breztri 2 puffs twice a day with a spacer.  She maybe takes it once or twice a week.  She also mentions that she forgets to brush her teeth at times due to her mental health. She was previously on Trelegy. She does receive Fasenra 30 mg injections every 8 weeks.  She denies any problems or reactions with her Fasenra injections.  She reports that she is using her albuterol quite frequently.  She will use her albuterol  2-3 times a day when she is short of breath.  Her albuterol does help.  Seasonal and perennial allergic rhinitis/eustachian tube dysfunction: She reports clear rhinorrhea, nasal congestion, postnasal drip, and "clogging" in her left ear at times.  She is currently taking carbinoxamine twice a day and using Nasonex nasal spray as needed.  Her insurance did not cover Xhance.  She has not been treated for any sinus infections since we last saw her.  Reflux is reported as being okay.  She continues to take pantoprazole 40 mg once a day   Drug Allergies:  No Known Allergies  Review of Systems: Negative except as per HPI   Physical Exam: BP 120/82   Pulse 66   Temp 98 F (36.7 C)   Resp 16   LMP 03/24/2024   SpO2 98%    Physical Exam Constitutional:      Appearance: Normal appearance.  HENT:     Head: Normocephalic and atraumatic.     Comments: Pharynx normal. Eyes normal. Ears normal. Nose: bilateral lower turbinates mildly edematous with no drainage noted.    Right Ear: Tympanic membrane, ear canal and external ear normal.     Left Ear: Tympanic membrane, ear canal and external ear normal.     Mouth/Throat:     Mouth: Mucous membranes are moist.     Pharynx: Oropharynx is clear.  Eyes:     Conjunctiva/sclera: Conjunctivae normal.  Cardiovascular:     Rate and Rhythm: Regular rhythm.  Pulmonary:  Effort: Pulmonary effort is normal.     Breath sounds: Normal breath sounds.     Comments: Lungs clear to auscultation Musculoskeletal:     Cervical back: Neck supple.  Skin:    General: Skin is warm.  Neurological:     Mental Status: She is alert and oriented to person, place, and time.  Psychiatric:        Mood and Affect: Mood normal.        Behavior: Behavior normal.        Thought Content: Thought content normal.        Judgment: Judgment normal.     Diagnostics: FVC 3.24 L (79%), FEV1 2.29 L (67%), FEV1/FVC 0.71.  Spirometry indicates moderate airway  obstruction.  Assessment and Plan: 1. Seasonal and perennial allergic rhinitis   2. Not well controlled severe persistent asthma   3. Gastroesophageal reflux disease, unspecified whether esophagitis present   4. Seasonal allergic conjunctivitis     No orders of the defined types were placed in this encounter.   Patient Instructions  Asthma-not well controlled Your breathing test today shows inflammation Continue Breztri 2 puffs twice a day with a spacer to prevent cough or wheeze. Make sure and take this medication everyday as prescribed. Set reminders on your phone. Sample given Continue albuterol 2 puffs once every 4 hours as needed for cough or wheeze You may use albuterol 2 puffs 5 to 15 minutes before activity to decrease cough or wheeze  Asthma control goals:  Full participation in all desired activities (may need albuterol before activity) Albuterol use two time or less a week on average (not counting use with activity) Cough interfering with sleep two time or less a month Oral steroids no more than once a year No hospitalizations   Allergic rhinitis-not well controlled Continue allergen avoidance measures directed toward pollen, mold, pets, and cockroach as listed below Continue carbinoxamine 4 mg tablets. Take 2 tablets twice a day as needed for nasal symptoms  Continue Nasonex nasal spray 2 sprays in each nostril twice a day for nasal symptoms Continue saline nasal rinses as needed for nasal symptoms. Use this before any medicated nasal sprays for best result Consider  allergen immunotherapy once your asthma gets under better control  Allergic conjunctivitis Consider using a lubricant drop such as Systane or refresh as needed Some over the counter eye drops include Pataday one drop in each eye once a day as needed for red, itchy eyes OR Zaditor one drop in each eye twice a day as needed for red itchy eyes.  Recurrent sinusitis Keep track of infections, antibiotic, and  steroid use  Reflux-controlled Continue dietary and lifestyle modifications as listed below Continue pantoprazole 40 mg once a day as previously prescribed. Continue to take this medication 30 minutes before your first meal for best results.   Eustachian tube dysfunction Continue nasal saline rinses followed by Timmothy Sours twice a day  Call the clinic if this treatment plan is not working well for you.  Follow up in 6 weeks or sooner if needed.  Reducing Pollen Exposure The American Academy of Allergy, Asthma and Immunology suggests the following steps to reduce your exposure to pollen during allergy seasons. Do not hang sheets or clothing out to dry; pollen may collect on these items. Do not mow lawns or spend time around freshly cut grass; mowing stirs up pollen. Keep windows closed at night.  Keep car windows closed while driving. Minimize morning activities outdoors, a time when pollen counts  are usually at their highest. Stay indoors as much as possible when pollen counts or humidity is high and on windy days when pollen tends to remain in the air longer. Use air conditioning when possible.  Many air conditioners have filters that trap the pollen spores. Use a HEPA room air filter to remove pollen form the indoor air you breathe.  Control of Mold Allergen Mold and fungi can grow on a variety of surfaces provided certain temperature and moisture conditions exist.  Outdoor molds grow on plants, decaying vegetation and soil.  The major outdoor mold, Alternaria and Cladosporium, are found in very high numbers during hot and dry conditions.  Generally, a late Summer - Fall peak is seen for common outdoor fungal spores.  Rain will temporarily lower outdoor mold spore count, but counts rise rapidly when the rainy period ends.  The most important indoor molds are Aspergillus and Penicillium.  Dark, humid and poorly ventilated basements are ideal sites for mold growth.  The next most common sites of  mold growth are the bathroom and the kitchen.  Outdoor [employer] Use air conditioning and keep windows closed Avoid exposure to decaying vegetation. Avoid leaf raking. Avoid grain handling. Consider wearing a face mask if working in moldy areas.  Indoor Mold Control Maintain humidity below 50%. Clean washable surfaces with 5% bleach solution. Remove sources e.g. Contaminated carpets. Control of Dog or Cat Allergen Avoidance is the best way to manage a dog or cat allergy. If you have a dog or cat and are allergic to dog or cats, consider removing the dog or cat from the home. If you have a dog or cat but don't want to find it a new home, or if your family wants a pet even though someone in the household is allergic, here are some strategies that may help keep symptoms at bay:  Keep the pet out of your bedroom and restrict it to only a few rooms. Be advised that keeping the dog or cat in only one room will not limit the allergens to that room. Don't pet, hug or kiss the dog or cat; if you do, wash your hands with soap and water. High-efficiency particulate air (HEPA) cleaners run continuously in a bedroom or living room can reduce allergen levels over time. Regular use of a high-efficiency vacuum cleaner or a central vacuum can reduce allergen levels. Giving your dog or cat a bath at least once a week can reduce airborne allergen.  Control of Cockroach Allergen Cockroach allergen has been identified as an important cause of acute attacks of asthma, especially in urban settings.  There are fifty-five species of cockroach that exist in the [place], however only three, the [place], [place] species produce allergen that can affect patients with Asthma.  Allergens can be obtained from fecal particles, egg casings and secretions from cockroaches.    Remove food sources. Reduce access to water. Seal access and entry points. Spray runways with 0.5-1% Diazinon or  Chlorpyrifos Blow boric acid power under stoves and refrigerator. Place bait stations (hydramethylnon) at feeding sites.    Lifestyle Changes for Controlling GERD When you have GERD, stomach acid feels as if it's backing up toward your mouth. Whether or not you take medication to control your GERD, your symptoms can often be improved with lifestyle changes.   Raise Your Head Reflux is more likely to strike when you're lying down flat, because stomach fluid can flow backward more easily. Raising the head of  your bed 4-6 inches can help. To do this: Slide blocks or books under the legs at the head of your bed. Or, place a wedge under the mattress. Many foam stores can make a suitable wedge for you. The wedge should run from your waist to the top of your head. Don't just prop your head on several pillows. This increases pressure on your stomach. It can make GERD worse.  Watch Your Eating Habits Certain foods may increase the acid in your stomach or relax the lower esophageal sphincter, making GERD more likely. It's best to avoid the following: Coffee, tea, and carbonated drinks (with and without caffeine) Fatty, fried, or spicy food Mint, chocolate, onions, and tomatoes Any other foods that seem to irritate your stomach or cause you pain  Relieve the Pressure Eat smaller meals, even if you have to eat more often. Don't lie down right after you eat. Wait a few hours for your stomach to empty. Avoid tight belts and tight-fitting clothes. Lose excess weight.  Tobacco and Alcohol Avoid smoking tobacco and drinking alcohol. They can make GERD symptoms worse.  Return in about 6 weeks (around 05/09/2024), or if symptoms worsen or fail to improve.    Thank you for the opportunity to care for this patient.  Please do not hesitate to contact me with questions.  Nehemiah Settle, FNP [facility]

## 2024-03-28 NOTE — Telephone Encounter (Signed)
 Thank you :)

## 2024-03-31 NOTE — Addendum Note (Signed)
 Addended by: Kellie Simmering, Briggs Edelen on: 03/31/2024 05:19 PM   Modules accepted: Orders

## 2024-04-25 ENCOUNTER — Ambulatory Visit (INDEPENDENT_AMBULATORY_CARE_PROVIDER_SITE_OTHER): Admitting: Family

## 2024-04-25 ENCOUNTER — Encounter: Payer: Self-pay | Admitting: Family

## 2024-04-25 VITALS — BP 137/85 | HR 72 | Temp 98.0°F | Resp 18 | Ht 66.0 in | Wt 255.2 lb

## 2024-04-25 DIAGNOSIS — I10 Essential (primary) hypertension: Secondary | ICD-10-CM | POA: Diagnosis not present

## 2024-04-25 DIAGNOSIS — Z13228 Encounter for screening for other metabolic disorders: Secondary | ICD-10-CM

## 2024-04-25 DIAGNOSIS — F418 Other specified anxiety disorders: Secondary | ICD-10-CM

## 2024-04-25 DIAGNOSIS — Z13220 Encounter for screening for lipoid disorders: Secondary | ICD-10-CM

## 2024-04-25 DIAGNOSIS — Z0000 Encounter for general adult medical examination without abnormal findings: Secondary | ICD-10-CM

## 2024-04-25 DIAGNOSIS — Z1159 Encounter for screening for other viral diseases: Secondary | ICD-10-CM

## 2024-04-25 DIAGNOSIS — Z131 Encounter for screening for diabetes mellitus: Secondary | ICD-10-CM

## 2024-04-25 DIAGNOSIS — F32A Depression, unspecified: Secondary | ICD-10-CM

## 2024-04-25 DIAGNOSIS — Z114 Encounter for screening for human immunodeficiency virus [HIV]: Secondary | ICD-10-CM

## 2024-04-25 DIAGNOSIS — Z1329 Encounter for screening for other suspected endocrine disorder: Secondary | ICD-10-CM

## 2024-04-25 DIAGNOSIS — Z130 Encounter for screening for diseases of the blood and blood-forming organs and certain disorders involving the immune mechanism: Secondary | ICD-10-CM

## 2024-04-25 DIAGNOSIS — M25571 Pain in right ankle and joints of right foot: Secondary | ICD-10-CM

## 2024-04-25 DIAGNOSIS — F419 Anxiety disorder, unspecified: Secondary | ICD-10-CM

## 2024-04-25 DIAGNOSIS — Z124 Encounter for screening for malignant neoplasm of cervix: Secondary | ICD-10-CM

## 2024-04-25 MED ORDER — TRAZODONE HCL 50 MG PO TABS: 50.0000 mg | ORAL_TABLET | Freq: Every evening | ORAL | 2 refills | Status: DC | PRN

## 2024-04-25 MED ORDER — GABAPENTIN 300 MG PO CAPS: 300.0000 mg | ORAL_CAPSULE | Freq: Three times a day (TID) | ORAL | 2 refills | Status: DC

## 2024-04-25 MED ORDER — CARVEDILOL 6.25 MG PO TABS: 6.2500 mg | ORAL_TABLET | Freq: Two times a day (BID) | ORAL | 0 refills | Status: DC

## 2024-04-25 NOTE — Progress Notes (Signed)
 Itching in right ear, patient needs medication refills and a new mental health MD, scored high on depression screening and anxiety screening

## 2024-04-25 NOTE — Progress Notes (Signed)
 Patient ID: Elizabeth Frank, female    DOB: 05-Apr-1991  MRN: [id number]  CC: Annual Exam  Subjective: Elizabeth Frank is a 33 y.o. female who presents for annual exam.   Her concerns today include:  - Doing well on Carvedilol , no issues/concerns. She does not complain of red flag symptoms such as but not limited to chest pain, shortness of breath, worst headache of life, nausea/vomiting.  - Anxiety depression. Doing well on Fluoxetine, no issues/concerns and states she does not need refills presently. States she needs refills on Gabapentin and Trazodone. States she was established with Psychiatry but needs new referral due to they are out of health insurance network. She denies thoughts of self-harm, suicidal ideations, homicidal ideations. - States she missed call from Orthopedics.  - States currently her Gynecology is located in [facility] and would like a new referral to an office closer to [place], [place].  - Right ear itching. Denies red flag symptoms. Exam unremarkable.    Patient Active Problem List   Diagnosis Date Noted   Not well controlled severe persistent asthma 04/25/2023   Dysfunction of both eustachian tubes 04/25/2023   Moderate persistent asthma without complication 07/17/2022   Seasonal and perennial allergic rhinitis 07/17/2022   Recurrent infections 07/17/2022   Gastroesophageal reflux disease 07/17/2022   Seasonal allergic conjunctivitis 07/17/2022   Encounter for gynecological examination with Papanicolaou smear of cervix 05/20/2021   Elevated BP without diagnosis of hypertension 05/20/2021   History of abnormal Pap smear 04/17/2013     Current Outpatient Medications on File Prior to Visit  Medication Sig Dispense Refill   albuterol  (VENTOLIN  HFA) 108 (90 Base) MCG/ACT inhaler Inhale 2 puffs into the lungs every 4 (four) hours as needed for wheezing or shortness of breath. 18 g 1   Benralizumab  (FASENRA  PEN) 30 MG/ML SOAJ Inject 1 mL (30 mg total) into  the skin every 28 (twenty-eight) days. 1 mL 11   BREZTRI  AEROSPHERE 160-9-4.8 MCG/ACT AERO Inhale 2 puffs into the lungs in the morning and at bedtime. 10.7 g 5   Carbinoxamine  Maleate 4 MG TABS Take 1 tablet (4 mg total) by mouth 2 (two) times daily as needed (allergies). 190 tablet 1   FASENRA  30 MG/ML prefilled syringe INJECT 1 SYRINGE UNDER THE SKIN EVERY 8 WEEKS 1 mL 6   FLUoxetine (PROZAC) 40 MG capsule Take 80 mg by mouth daily.     guanFACINE (INTUNIV) 1 MG TB24 ER tablet Take 1 mg by mouth at bedtime.     mometasone  (NASONEX ) 50 MCG/ACT nasal spray Place 2 sprays into the nose daily. 1 each 5   Olopatadine  HCl (PATADAY ) 0.2 % SOLN Place 1 drop into both eyes daily as needed. 2.5 mL 3   pantoprazole  (PROTONIX ) 40 MG tablet Take 1 tablet (40 mg total) by mouth daily. 90 tablet 0   amoxicillin -clavulanate (AUGMENTIN ) 875-125 MG tablet Take 1 tablet by mouth 2 (two) times daily. (Patient not taking: Reported on 03/24/2024) 20 tablet 0   amphetamine-dextroamphetamine (ADDERALL XR) 30 MG 24 hr capsule Take 30 mg by mouth every morning. (Patient not taking: Reported on 03/24/2024)     predniSONE  (DELTASONE ) 20 MG tablet Take 2 tablets (40 mg total) by mouth daily with breakfast. (Patient not taking: Reported on 03/24/2024) 10 tablet 0   promethazine -dextromethorphan (PROMETHAZINE -DM) 6.25-15 MG/5ML syrup Take 5 mLs by mouth 4 (four) times daily as needed. (Patient not taking: Reported on 03/24/2024) 118 mL 0   topiramate  (TOPAMAX ) 25 MG tablet Take  1 tablet (25 mg total) by mouth daily. (Patient not taking: Reported on 04/25/2024) 30 tablet 2   VYVANSE 70 MG capsule Take 70 mg by mouth every morning. (Patient not taking: Reported on 11/22/2023)     XHANCE  93 MCG/ACT EXHU Place 1-2 sprays into the nose 2 (two) times daily as needed. (Patient not taking: Reported on 03/24/2024) 32 mL 5   Current Facility-Administered Medications on File Prior to Visit  Medication Dose Route Frequency Provider Last Rate Last  Admin   Benralizumab  SOSY 30 mg  30 mg Subcutaneous Q28 days Rochester Chuck, MD   30 mg at 03/20/24 1441    No Known Allergies  Social History   Socioeconomic History   Marital status: Single    Spouse name: Not on file   Number of children: 0   Years of education: 16   Highest education level: Bachelor's degree (e.g., BA, AB, BS)  Occupational History   Occupation: Magazine features editor: [employer]  Tobacco Use   Smoking status: Never    Passive exposure: Never   Smokeless tobacco: Never  Vaping Use   Vaping status: Never Used  Substance and Sexual Activity   Alcohol use: Yes    Comment: oOccasionally   Drug use: Yes    Frequency: 2.0 times per week    Types: Marijuana    Comment: Patient states she had 1 gummy 06/02/2022   Sexual activity: Yes    Birth control/protection: Pill, Condom  Other Topics Concern   Not on file  Social History Narrative   Patient is left-handed. She lives with friends in a one level home. She occasionally drinks caffeine. She does not exercise.    Social Drivers of Corporate investment banker Strain: High Risk (03/24/2024)   Overall Financial Resource Strain (CARDIA)    Difficulty of Paying Living Expenses: Hard  Food Insecurity: No Food Insecurity (03/24/2024)   Hunger Vital Sign    Worried About Running Out of Food in the Last Year: Never true    Ran Out of Food in the Last Year: Never true  Transportation Needs: No Transportation Needs (03/24/2024)   PRAPARE - Administrator, Civil Service (Medical): No    Lack of Transportation (Non-Medical): No  Physical Activity: Unknown (03/24/2024)   Exercise Vital Sign    Days of Exercise per Week: Patient declined    Minutes of Exercise per Session: Not on file  Stress: Stress Concern Present (03/24/2024)   [employer] of Occupational Health - Occupational Stress Questionnaire    Feeling of Stress : Very much  Social Connections: Socially Isolated (03/24/2024)    Social Connection and Isolation Panel [NHANES]    Frequency of Communication with Friends and Family: Once a week    Frequency of Social Gatherings with Friends and Family: Once a week    Attends Religious Services: Never    Database administrator or Organizations: No    Attends Engineer, structural: Not on file    Marital Status: Living with partner  Intimate Partner Violence: Not At Risk (02/14/2024)   Received from [facility]   HITS    Over the last 12 months how often did your partner physically hurt you?: Never    Over the last 12 months how often did your partner insult you or talk down to you?: Never    Over the last 12 months how often did your partner threaten you with physical harm?: Never  Over the last 12 months how often did your partner scream or curse at you?: Never    Family History  Problem Relation Age of Onset   Hypertension Mother    Hypertension Father     Past Surgical History:  Procedure Laterality Date   OVARIAN CYST REMOVAL     TOOTH EXTRACTION      ROS: Review of Systems Negative except as stated above  PHYSICAL EXAM: BP 137/85   Pulse 72   Temp 98 F (36.7 C) (Oral)   Resp 18   Ht 5\' 6"  (1.676 m)   Wt 255 lb 3.2 oz (115.8 kg)   LMP 04/19/2024 (Exact Date)   SpO2 96%   BMI 41.19 kg/m   Physical Exam HENT:     Head: Normocephalic and atraumatic.     Right Ear: Tympanic membrane, ear canal and external ear normal.     Left Ear: Tympanic membrane, ear canal and external ear normal.     Nose: Nose normal.     Mouth/Throat:     Mouth: Mucous membranes are moist.     Pharynx: Oropharynx is clear.  Eyes:     Extraocular Movements: Extraocular movements intact.     Conjunctiva/sclera: Conjunctivae normal.     Pupils: Pupils are equal, round, and reactive to light.  Neck:     Thyroid: No thyroid mass, thyromegaly or thyroid tenderness.  Cardiovascular:     Rate and Rhythm: Normal rate and regular rhythm.     Pulses: Normal  pulses.     Heart sounds: Normal heart sounds.  Pulmonary:     Effort: Pulmonary effort is normal.     Breath sounds: Normal breath sounds.  Chest:     Comments: Patient declined.  Abdominal:     General: Bowel sounds are normal.     Palpations: Abdomen is soft.  Genitourinary:    Comments: Patient declined. Musculoskeletal:        General: Normal range of motion.     Right shoulder: Normal.     Left shoulder: Normal.     Right upper arm: Normal.     Left upper arm: Normal.     Right elbow: Normal.     Left elbow: Normal.     Right forearm: Normal.     Left forearm: Normal.     Right wrist: Normal.     Left wrist: Normal.     Right hand: Normal.     Left hand: Normal.     Cervical back: Normal, normal range of motion and neck supple.     Thoracic back: Normal.     Lumbar back: Normal.     Right hip: Normal.     Left hip: Normal.     Right upper leg: Normal.     Left upper leg: Normal.     Right knee: Normal.     Left knee: Normal.     Right lower leg: Normal.     Left lower leg: Normal.     Right ankle: Normal.     Left ankle: Normal.     Right foot: Normal.     Left foot: Normal.  Skin:    General: Skin is warm and dry.     Capillary Refill: Capillary refill takes less than 2 seconds.  Neurological:     General: No focal deficit present.     Mental Status: She is alert and oriented to person, place, and time.  Psychiatric:  Mood and Affect: Mood normal.        Behavior: Behavior normal.    ASSESSMENT AND PLAN: 1. Annual physical exam (Primary) - Counseled on 150 minutes of exercise per week as tolerated, healthy eating (including decreased daily intake of saturated fats, cholesterol, added sugars, sodium), STI prevention, and routine healthcare maintenance.  2. Screening for metabolic disorder - Routine screening.  - Hepatic Function Panel  3. Screening for deficiency anemia - Routine screening.  - CBC  4. Diabetes mellitus screening - Routine  screening.  - Hemoglobin A1c  5. Screening cholesterol level - Routine screening.  - Lipid panel  6. Thyroid disorder screen - Routine screening.  - TSH  7. Encounter for screening for HIV - Routine screening.  - HIV antibody (with reflex)  8. Need for hepatitis C screening test - Routine screening.  - Hepatitis C Antibody  9. Cervical cancer screening - Referral to Gynecology for evaluation/management. - Ambulatory referral to Gynecology  10. Primary hypertension - Continue Carvedilol  as prescribed.  - Counseled on blood pressure goal of less than 130/80, low-sodium, DASH diet, medication compliance, and 150 minutes of moderate intensity exercise per week as tolerated. Counseled on medication adherence and adverse effects. - Follow-up with primary provider in 3 months or sooner if needed.  - carvedilol  (COREG ) 6.25 MG tablet; Take 1 tablet (6.25 mg total) by mouth 2 (two) times daily.  Dispense: 180 tablet; Refill: 0  11. Anxiety and depression - Patient denies thoughts of self-harm, suicidal ideations, homicidal ideations. - Continue Fluoxetine as prescribed. Counseled on medication adherence/adverse effects. No refills needed as of present.  - Continue Gabapentin and Trazodone as prescribed. Counseled on medication adherence/adverse effects.  - Referral to Psychiatry for evaluation/management.  - Follow-up with primary provider as scheduled. - gabapentin (NEURONTIN) 300 MG capsule; Take 1 capsule (300 mg total) by mouth 3 (three) times daily.  Dispense: 90 capsule; Refill: 2 - traZODone (DESYREL) 50 MG tablet; Take 1 tablet (50 mg total) by mouth at bedtime as needed.  Dispense: 30 tablet; Refill: 2 - Ambulatory referral to Psychiatry  12. Right ankle pain, unspecified chronicity - Referral to Orthopedic Surgery for evaluation/management. - Ambulatory referral to Orthopedic Surgery   Patient was given the opportunity to ask questions.  Patient verbalized understanding  of the plan and was able to repeat key elements of the plan. Patient was given clear instructions to go to Emergency Department or return to medical center if symptoms don't improve, worsen, or new problems develop.The patient verbalized understanding.   Orders Placed This Encounter  Procedures   Hepatic Function Panel   Hemoglobin A1c   Lipid panel   TSH   CBC   HIV antibody (with reflex)   Hepatitis C Antibody   Ambulatory referral to Psychiatry   Ambulatory referral to Gynecology   Ambulatory referral to Orthopedic Surgery     Requested Prescriptions   Signed Prescriptions Disp Refills   carvedilol  (COREG ) 6.25 MG tablet 180 tablet 0    Sig: Take 1 tablet (6.25 mg total) by mouth 2 (two) times daily.   gabapentin (NEURONTIN) 300 MG capsule 90 capsule 2    Sig: Take 1 capsule (300 mg total) by mouth 3 (three) times daily.   traZODone (DESYREL) 50 MG tablet 30 tablet 2    Sig: Take 1 tablet (50 mg total) by mouth at bedtime as needed.    Return in about 1 year (around 04/25/2025) for Physical per patient preference.  Senaida Dama, NP

## 2024-04-26 ENCOUNTER — Encounter: Payer: Self-pay | Admitting: Family

## 2024-04-26 ENCOUNTER — Other Ambulatory Visit: Payer: Self-pay | Admitting: Family

## 2024-04-26 DIAGNOSIS — Z13220 Encounter for screening for lipoid disorders: Secondary | ICD-10-CM

## 2024-04-26 LAB — HIV ANTIBODY (ROUTINE TESTING W REFLEX): HIV Screen 4th Generation wRfx: NONREACTIVE

## 2024-04-26 LAB — HEPATIC FUNCTION PANEL
ALT: 19 IU/L (ref 0–32)
AST: 17 IU/L (ref 0–40)
Albumin: 4 g/dL (ref 3.9–4.9)
Alkaline Phosphatase: 87 IU/L (ref 44–121)
Bilirubin Total: <0.2 mg/dL (ref 0.0–1.2)
Bilirubin, Direct: <0.08 mg/dL (ref 0.00–0.40)
Total Protein: 6.6 g/dL (ref 6.0–8.5)

## 2024-04-26 LAB — CBC
Hematocrit: 39.5 % (ref 34.0–46.6)
Hemoglobin: 12.5 g/dL (ref 11.1–15.9)
MCH: 28 pg (ref 26.6–33.0)
MCHC: 31.6 g/dL (ref 31.5–35.7)
MCV: 88 fL (ref 79–97)
Platelets: 270 10*3/uL (ref 150–450)
RBC: 4.47 x10E6/uL (ref 3.77–5.28)
RDW: 14.5 % (ref 11.7–15.4)
WBC: 7 10*3/uL (ref 3.4–10.8)

## 2024-04-26 LAB — LIPID PANEL
Chol/HDL Ratio: 3.4 ratio (ref 0.0–4.4)
Cholesterol, Total: 210 mg/dL — ABNORMAL HIGH (ref 100–199)
HDL: 61 mg/dL (ref >39)
LDL Chol Calc (NIH): 131 mg/dL — ABNORMAL HIGH (ref 0–99)
Triglycerides: 99 mg/dL (ref 0–149)
VLDL Cholesterol Cal: 18 mg/dL (ref 5–40)

## 2024-04-26 LAB — TSH: TSH: 3.19 u[IU]/mL (ref 0.450–4.500)

## 2024-04-26 LAB — HEMOGLOBIN A1C
Est. average glucose Bld gHb Est-mCnc: 100 mg/dL
Hgb A1c MFr Bld: 5.1 % (ref 4.8–5.6)

## 2024-04-26 LAB — HEPATITIS C ANTIBODY: Hep C Virus Ab: NONREACTIVE

## 2024-05-04 ENCOUNTER — Ambulatory Visit: Admitting: Orthopedic Surgery

## 2024-05-09 ENCOUNTER — Ambulatory Visit: Admitting: Internal Medicine

## 2024-05-11 ENCOUNTER — Ambulatory Visit: Admitting: Family Medicine

## 2024-05-16 ENCOUNTER — Ambulatory Visit

## 2024-05-18 ENCOUNTER — Other Ambulatory Visit (INDEPENDENT_AMBULATORY_CARE_PROVIDER_SITE_OTHER): Payer: Self-pay

## 2024-05-18 ENCOUNTER — Ambulatory Visit: Admitting: Orthopedic Surgery

## 2024-05-18 DIAGNOSIS — M79672 Pain in left foot: Secondary | ICD-10-CM

## 2024-05-19 ENCOUNTER — Encounter: Payer: Self-pay | Admitting: Orthopedic Surgery

## 2024-05-19 NOTE — Progress Notes (Signed)
 Office Visit Note   Patient: Elizabeth Frank           Date of Birth: [birth year]           MRN: [id number] Visit Date: 05/18/2024              Requested by: Senaida Dama, NP [address] [place],  [place] [address] PCP: Senaida Dama, NP  Chief Complaint  Patient presents with   Right Ankle - Pain      HPI: Patient is a 33 year old woman who is seen for initial evaluation for right foot and ankle pain.  Patient states that she has been diagnosed with plantar fasciitis in the past.  Patient is seeing her primary care physician April 4.  Patient states that she injured her left foot Saturday when she fell and kicked something.  Assessment & Plan: Visit Diagnoses:  1. Pain in left foot     Plan: Patient was given instructions and demonstrated Achilles stretching.  Recommend a stiff soled sneaker to unload the plantar fascia.  No plantar fascial symptoms at this time.  Follow-Up Instructions: Return if symptoms worsen or fail to improve.   Ortho Exam  Patient is alert, oriented, no adenopathy, well-dressed, normal affect, normal respiratory effort. Examination patient has some bruising over the toes on the left radiograph shows no fractures.  Examination of the right foot she is tender to palpation across the Lisfranc complex radiographic and bony abnormalities.  The plantar fascia is not tender to palpation bilaterally.  With her knee extended she has dorsiflexion to neutral bilaterally. Narcotics: .CHLMME  Imaging: XR Foot Complete Left Result Date: 05/19/2024 Three-view radiographs of the left foot shows no bony abnormalities no stress fractures no joint space narrowing.  No images are attached to the encounter.  Labs: Lab Results  Component Value Date   HGBA1C 5.1 04/25/2024   REPTSTATUS 04/29/2019 FINAL 04/26/2019   CULT  04/26/2019    NO GROUP A STREP (S.PYOGENES) ISOLATED Performed at [facility], [address] [place], [place]  [address]      Lab Results  Component Value Date   ALBUMIN 4.0 04/25/2024   ALBUMIN 3.9 03/05/2015    No results found for: "MG" No results found for: "VD25OH"  No results found for: "PREALBUMIN"    Latest Ref Rng & Units 04/25/2024    3:30 PM 11/16/2022    4:33 PM 03/05/2015   10:23 PM  CBC EXTENDED  WBC 3.4 - 10.8 x10E3/uL 7.0  6.2  10.9   RBC 3.77 - 5.28 x10E6/uL 4.47  4.40  4.54   Hemoglobin 11.1 - 15.9 g/dL 40.1  02.7  25.3   HCT 34.0 - 46.6 % 39.5  38.6  39.4   Platelets 150 - 450 x10E3/uL 270  206  153   NEUT# 1.4 - 7.0 x10E3/uL  3.0    Lymph# 0.7 - 3.1 x10E3/uL  2.5       There is no height or weight on file to calculate BMI.  Orders:  Orders Placed This Encounter  Procedures   XR Foot Complete Left   No orders of the defined types were placed in this encounter.    Procedures: No procedures performed  Clinical Data: No additional findings.  ROS:  All other systems negative, except as noted in the HPI. Review of Systems  Objective: Vital Signs: LMP 04/19/2024 (Exact Date)   Specialty Comments:  No specialty comments available.  PMFS History: Patient Active  Problem List   Diagnosis Date Noted   Not well controlled severe persistent asthma 04/25/2023   Dysfunction of both eustachian tubes 04/25/2023   Moderate persistent asthma without complication 07/17/2022   Seasonal and perennial allergic rhinitis 07/17/2022   Recurrent infections 07/17/2022   Gastroesophageal reflux disease 07/17/2022   Seasonal allergic conjunctivitis 07/17/2022   Encounter for gynecological examination with Papanicolaou smear of cervix 05/20/2021   Elevated BP without diagnosis of hypertension 05/20/2021   History of abnormal Pap smear 04/17/2013   Past Medical History:  Diagnosis Date   ADHD    Allergic rhinitis    Anxiety    Asthma    Frequent headaches    Hypertension    Recurrent sinusitis     Family History  Problem Relation Age of Onset   Hypertension  Mother    Hypertension Father     Past Surgical History:  Procedure Laterality Date   OVARIAN CYST REMOVAL     TOOTH EXTRACTION     Social History   Occupational History   Occupation: Magazine features editor: [employer]  Tobacco Use   Smoking status: Never    Passive exposure: Never   Smokeless tobacco: Never  Vaping Use   Vaping status: Never Used  Substance and Sexual Activity   Alcohol use: Yes    Comment: oOccasionally   Drug use: Yes    Frequency: 2.0 times per week    Types: Marijuana    Comment: Patient states she had 1 gummy 06/02/2022   Sexual activity: Yes    Birth control/protection: Pill, Condom

## 2024-05-24 ENCOUNTER — Ambulatory Visit

## 2024-05-24 ENCOUNTER — Other Ambulatory Visit: Payer: Self-pay | Admitting: Family Medicine

## 2024-05-24 ENCOUNTER — Ambulatory Visit: Admitting: Family

## 2024-05-24 NOTE — Patient Instructions (Incomplete)
 Asthma-not well controlled Continue Fasenra  injections per protocol Continue Breztri  2 puffs twice a day with a spacer to prevent cough or wheeze. Make sure and take this medication everyday as prescribed. Set reminders on your phone. Sample given Continue albuterol  2 puffs once every 4 hours as needed for cough or wheeze You may use albuterol  2 puffs 5 to 15 minutes before activity to decrease cough or wheeze  Asthma control goals:  Full participation in all desired activities (may need albuterol  before activity) Albuterol  use two time or less a week on average (not counting use with activity) Cough interfering with sleep two time or less a month Oral steroids no more than once a year No hospitalizations   Allergic rhinitis-not well controlled Continue allergen avoidance measures directed toward pollen, mold, pets, and cockroach as listed below Continue carbinoxamine  4 mg tablets. Take 2 tablets twice a day as needed for nasal symptoms  Continue Nasonex  nasal spray 2 sprays in each nostril twice a day for nasal symptoms Continue saline nasal rinses as needed for nasal symptoms. Use this before any medicated nasal sprays for best result Consider  allergen immunotherapy once your asthma gets under better control  Allergic conjunctivitis Consider using a lubricant drop such as Systane or refresh as needed Some over the counter eye drops include Pataday  one drop in each eye once a day as needed for red, itchy eyes OR Zaditor one drop in each eye twice a day as needed for red itchy eyes.  Recurrent sinusitis Keep track of infections, antibiotic, and steroid use  Reflux-controlled Continue dietary and lifestyle modifications as listed below Continue pantoprazole  40 mg once a day as previously prescribed. Continue to take this medication 30 minutes before your first meal for best results.   Eustachian tube dysfunction Continue nasal saline rinses followed by Nasonex    Call the clinic if  this treatment plan is not working well for you.  Follow up in months or sooner if needed.  Reducing Pollen Exposure The American Academy of Allergy , Asthma and Immunology suggests the following steps to reduce your exposure to pollen during allergy  seasons. Do not hang sheets or clothing out to dry; pollen may collect on these items. Do not mow lawns or spend time around freshly cut grass; mowing stirs up pollen. Keep windows closed at night.  Keep car windows closed while driving. Minimize morning activities outdoors, a time when pollen counts are usually at their highest. Stay indoors as much as possible when pollen counts or humidity is high and on windy days when pollen tends to remain in the air longer. Use air conditioning when possible.  Many air conditioners have filters that trap the pollen spores. Use a HEPA room air filter to remove pollen form the indoor air you breathe.  Control of Mold Allergen Mold and fungi can grow on a variety of surfaces provided certain temperature and moisture conditions exist.  Outdoor molds grow on plants, decaying vegetation and soil.  The major outdoor mold, Alternaria and Cladosporium, are found in very high numbers during hot and dry conditions.  Generally, a late Summer - Fall peak is seen for common outdoor fungal spores.  Rain will temporarily lower outdoor mold spore count, but counts rise rapidly when the rainy period ends.  The most important indoor molds are Aspergillus and Penicillium.  Dark, humid and poorly ventilated basements are ideal sites for mold growth.  The next most common sites of mold growth are the bathroom and the kitchen.  Outdoor  Mold Control Use air conditioning and keep windows closed Avoid exposure to decaying vegetation. Avoid leaf raking. Avoid grain handling. Consider wearing a face mask if working in moldy areas.  Indoor Mold Control Maintain humidity below 50%. Clean washable surfaces with 5% bleach  solution. Remove sources e.g. Contaminated carpets. Control of Dog or Cat Allergen Avoidance is the best way to manage a dog or cat allergy . If you have a dog or cat and are allergic to dog or cats, consider removing the dog or cat from the home. If you have a dog or cat but don't want to find it a new home, or if your family wants a pet even though someone in the household is allergic, here are some strategies that may help keep symptoms at bay:  Keep the pet out of your bedroom and restrict it to only a few rooms. Be advised that keeping the dog or cat in only one room will not limit the allergens to that room. Don't pet, hug or kiss the dog or cat; if you do, wash your hands with soap and water. High-efficiency particulate air (HEPA) cleaners run continuously in a bedroom or living room can reduce allergen levels over time. Regular use of a high-efficiency vacuum cleaner or a central vacuum can reduce allergen levels. Giving your dog or cat a bath at least once a week can reduce airborne allergen.  Control of Cockroach Allergen Cockroach allergen has been identified as an important cause of acute attacks of asthma, especially in urban settings.  There are fifty-five species of cockroach that exist in the [place] , however only three, the [place], [place] and [place] species produce allergen that can affect patients with Asthma.  Allergens can be obtained from fecal particles, egg casings and secretions from cockroaches.    Remove food sources. Reduce access to water. Seal access and entry points. Spray runways with 0.5-1% Diazinon or Chlorpyrifos Blow boric acid power under stoves and refrigerator. Place bait stations (hydramethylnon) at feeding sites.    Lifestyle Changes for Controlling GERD When you have GERD, stomach acid feels as if it's backing up toward your mouth. Whether or not you take medication to control your GERD, your symptoms can often be improved with lifestyle  changes.   Raise Your Head Reflux is more likely to strike when you're lying down flat, because stomach fluid can flow backward more easily. Raising the head of your bed 4-6 inches can help. To do this: Slide blocks or books under the legs at the head of your bed. Or, place a wedge under the mattress. Many foam stores can make a suitable wedge for you. The wedge should run from your waist to the top of your head. Don't just prop your head on several pillows. This increases pressure on your stomach. It can make GERD worse.  Watch Your Eating Habits Certain foods may increase the acid in your stomach or relax the lower esophageal sphincter, making GERD more likely. It's best to avoid the following: Coffee, tea, and carbonated drinks (with and without caffeine) Fatty, fried, or spicy food Mint, chocolate, onions, and tomatoes Any other foods that seem to irritate your stomach or cause you pain  Relieve the Pressure Eat smaller meals, even if you have to eat more often. Don't lie down right after you eat. Wait a few hours for your stomach to empty. Avoid tight belts and tight-fitting clothes. Lose excess weight.  Tobacco and Alcohol Avoid smoking tobacco and drinking alcohol. They can make  GERD symptoms worse.

## 2024-06-19 ENCOUNTER — Encounter: Admitting: Radiology

## 2024-06-26 ENCOUNTER — Other Ambulatory Visit: Payer: Self-pay | Admitting: Internal Medicine

## 2024-07-17 ENCOUNTER — Other Ambulatory Visit: Payer: Self-pay | Admitting: Internal Medicine

## 2024-07-17 DIAGNOSIS — K219 Gastro-esophageal reflux disease without esophagitis: Secondary | ICD-10-CM

## 2024-07-28 ENCOUNTER — Encounter: Payer: Self-pay | Admitting: Radiology

## 2024-07-28 ENCOUNTER — Other Ambulatory Visit (HOSPITAL_COMMUNITY)
Admission: RE | Admit: 2024-07-28 | Discharge: 2024-07-28 | Disposition: A | Discharge status: Home / Self Care | Source: Ambulatory Visit | Attending: Radiology | Admitting: Radiology

## 2024-07-28 ENCOUNTER — Ambulatory Visit (INDEPENDENT_AMBULATORY_CARE_PROVIDER_SITE_OTHER): Admitting: Radiology

## 2024-07-28 VITALS — BP 126/92 | HR 63 | Ht 67.0 in | Wt 271.0 lb

## 2024-07-28 DIAGNOSIS — Z01419 Encounter for gynecological examination (general) (routine) without abnormal findings: Secondary | ICD-10-CM | POA: Insufficient documentation

## 2024-07-28 DIAGNOSIS — Z113 Encounter for screening for infections with a predominantly sexual mode of transmission: Secondary | ICD-10-CM

## 2024-07-28 DIAGNOSIS — Z1331 Encounter for screening for depression: Secondary | ICD-10-CM

## 2024-07-28 NOTE — Patient Instructions (Signed)

## 2024-07-28 NOTE — Progress Notes (Signed)
 Elizabeth Frank [birth year] [id number]   History:  33 y.o. G0 presents for annual exam. Had RLQ recently, resolved on its own. Stopped HTN and ADHD meds a few months ago.  Gynecologic History Patient's last menstrual period was 07/21/2024 (approximate). Period Cycle (Days): 28 Period Duration (Days): 4-5 Period Pattern: Regular Menstrual Flow: Light, Moderate, Heavy Menstrual Control: Tampon Dysmenorrhea: (!) Moderate Dysmenorrhea Symptoms: Cramping Contraception/Family planning: none Sexually active: yes Last Pap: 2022. Results were: normal   Obstetric History OB History  Gravida Para Term Preterm AB Living  0 0 0 0 0 0  SAB IAB Ectopic Multiple Live Births  0 0 0 0 0       07/28/2024   11:20 AM 04/25/2024    2:58 PM 05/20/2021    4:12 PM  Depression screen PHQ 2/9  Decreased Interest 3 1 3   Down, Depressed, Hopeless 2 3 3   PHQ - 2 Score 5 4 6   Altered sleeping 2 1 2   Tired, decreased energy 3 1 2   Change in appetite 3 0 3  Feeling bad or failure about yourself  1 3 2   Trouble concentrating 3 3 2   Moving slowly or fidgety/restless 1 0 2  Suicidal thoughts 0 0 2  PHQ-9 Score 18 12 21   Difficult doing work/chores Extremely dIfficult Very difficult      The following portions of the patient's history were reviewed and updated as appropriate: allergies, current medications, past family history, past medical history, past social history, past surgical history, and problem list.  ROS  Past medical history, past surgical history, family history and social history were all reviewed and documented in the EPIC chart.  Exam:  Vitals:   07/28/24 1118 07/28/24 1127  BP: (!) 124/96 (!) 126/92  Pulse: 63   SpO2: 99%   Weight: 271 lb (122.9 kg)   Height: 5' 7 (1.702 m)    Body mass index is 42.44 kg/m.  Physical Exam Vitals and nursing note reviewed. Exam conducted with a chaperone present.  Constitutional:      Appearance: Normal appearance. She is obese.  HENT:      Head: Normocephalic and atraumatic.  Neck:     Thyroid: No thyroid mass, thyromegaly or thyroid tenderness.  Cardiovascular:     Rate and Rhythm: Regular rhythm.     Heart sounds: Normal heart sounds.  Pulmonary:     Effort: Pulmonary effort is normal.     Breath sounds: Normal breath sounds.  Chest:  Breasts:    Breasts are symmetrical.     Right: Normal. No inverted nipple, mass, nipple discharge, skin change or tenderness.     Left: Normal. No inverted nipple, mass, nipple discharge, skin change or tenderness.  Abdominal:     General: Abdomen is flat. Bowel sounds are normal.     Palpations: Abdomen is soft.  Genitourinary:    General: Normal vulva.     Vagina: Normal. No vaginal discharge, bleeding or lesions.     Cervix: Normal. No discharge or lesion.     Uterus: Normal. Not enlarged and not tender.      Adnexa: Right adnexa normal and left adnexa normal.       Right: No mass, tenderness or fullness.         Left: No mass, tenderness or fullness.    Lymphadenopathy:     Upper Body:     Right upper body: No axillary adenopathy.     Left upper body: No axillary adenopathy.  Skin:  General: Skin is warm and dry.  Neurological:     Mental Status: She is alert and oriented to person, place, and time.  Psychiatric:        Mood and Affect: Mood normal.        Thought Content: Thought content normal.        Judgment: Judgment normal.      Darice Hoit, CMA present for exam  Assessment/Plan:   1. Well woman exam with routine gynecological exam (Primary) - Cytology - PAP( [place])  2. Screening for STDs (sexually transmitted diseases) - Cytology - PAP( [place])  3. Depression screen Positive,follow up with PCP      Return in about 1 year (around 07/28/2025) for Annual.  GINETTE COZIER B WHNP-BC 11:39 AM 07/28/2024

## 2024-08-02 ENCOUNTER — Ambulatory Visit: Payer: Self-pay | Admitting: Radiology

## 2024-08-02 LAB — CYTOLOGY - PAP
Chlamydia: NEGATIVE
Comment: NEGATIVE
Comment: NEGATIVE
Comment: NEGATIVE
Comment: NORMAL
Diagnosis: NEGATIVE
High risk HPV: NEGATIVE
Neisseria Gonorrhea: NEGATIVE
Trichomonas: NEGATIVE

## 2024-08-03 MED ORDER — METRONIDAZOLE 500 MG PO TABS: 500.0000 mg | ORAL_TABLET | Freq: Two times a day (BID) | ORAL | 0 refills | Status: AC

## 2024-09-17 ENCOUNTER — Encounter (HOSPITAL_COMMUNITY): Payer: Self-pay | Admitting: *Deleted

## 2024-09-17 ENCOUNTER — Other Ambulatory Visit: Payer: Self-pay

## 2024-09-17 ENCOUNTER — Emergency Department (HOSPITAL_COMMUNITY)
Admission: EM | Admit: 2024-09-17 | Discharge: 2024-09-18 | Disposition: A | Discharge status: Home / Self Care | Attending: Emergency Medicine | Admitting: Emergency Medicine

## 2024-09-17 DIAGNOSIS — R109 Unspecified abdominal pain: Secondary | ICD-10-CM | POA: Diagnosis present

## 2024-09-17 DIAGNOSIS — N132 Hydronephrosis with renal and ureteral calculous obstruction: Secondary | ICD-10-CM | POA: Diagnosis not present

## 2024-09-17 DIAGNOSIS — R748 Abnormal levels of other serum enzymes: Secondary | ICD-10-CM | POA: Insufficient documentation

## 2024-09-17 DIAGNOSIS — N201 Calculus of ureter: Secondary | ICD-10-CM

## 2024-09-17 DIAGNOSIS — R319 Hematuria, unspecified: Secondary | ICD-10-CM

## 2024-09-17 LAB — CBC
HCT: 41.1 % (ref 36.0–46.0)
Hemoglobin: 13.6 g/dL (ref 12.0–15.0)
MCH: 28 pg (ref 26.0–34.0)
MCHC: 33.1 g/dL (ref 30.0–36.0)
MCV: 84.7 fL (ref 80.0–100.0)
Platelets: 243 K/uL (ref 150–400)
RBC: 4.85 MIL/uL (ref 3.87–5.11)
RDW: 14.3 % (ref 11.5–15.5)
WBC: 8.1 K/uL (ref 4.0–10.5)
nRBC: 0 % (ref 0.0–0.2)

## 2024-09-17 LAB — COMPREHENSIVE METABOLIC PANEL WITH GFR
ALT: 15 U/L (ref 0–44)
AST: 17 U/L (ref 15–41)
Albumin: 3.7 g/dL (ref 3.5–5.0)
Alkaline Phosphatase: 58 U/L (ref 38–126)
Anion gap: 14 (ref 5–15)
BUN: 10 mg/dL (ref 6–20)
CO2: 19 mmol/L — ABNORMAL LOW (ref 22–32)
Calcium: 8.8 mg/dL — ABNORMAL LOW (ref 8.9–10.3)
Chloride: 103 mmol/L (ref 98–111)
Creatinine, Ser: 0.73 mg/dL (ref 0.44–1.00)
GFR, Estimated: >60 mL/min (ref >60)
Glucose, Bld: 125 mg/dL — ABNORMAL HIGH (ref 70–99)
Potassium: 3.8 mmol/L (ref 3.5–5.1)
Sodium: 136 mmol/L (ref 135–145)
Total Bilirubin: 0.4 mg/dL (ref 0.0–1.2)
Total Protein: 7.3 g/dL (ref 6.5–8.1)

## 2024-09-17 LAB — HCG, SERUM, QUALITATIVE: Preg, Serum: NEGATIVE

## 2024-09-17 LAB — LIPASE, BLOOD: Lipase: 104 U/L — ABNORMAL HIGH (ref 11–51)

## 2024-09-17 MED ORDER — ONDANSETRON 4 MG PO TBDP
4.0000 mg | ORAL_TABLET | Freq: Once | ORAL | Status: AC | PRN
  Administered 2024-09-17: 4 mg via ORAL
  Filled 2024-09-17: qty 1

## 2024-09-17 MED ORDER — OXYCODONE-ACETAMINOPHEN 5-325 MG PO TABS
1.0000 | ORAL_TABLET | Freq: Once | ORAL | Status: AC
  Administered 2024-09-17: 1 via ORAL
  Filled 2024-09-17: qty 1

## 2024-09-17 NOTE — ED Triage Notes (Signed)
 The pt is c/o lt flank pain with nausea and vomiting today no urinary symptoms   she is also c/o pain in her vagina  lmp last week

## 2024-09-18 ENCOUNTER — Emergency Department (HOSPITAL_COMMUNITY)

## 2024-09-18 LAB — URINALYSIS, ROUTINE W REFLEX MICROSCOPIC
Bilirubin Urine: NEGATIVE
Glucose, UA: NEGATIVE mg/dL
Ketones, ur: NEGATIVE mg/dL
Leukocytes,Ua: NEGATIVE
Nitrite: NEGATIVE
Protein, ur: NEGATIVE mg/dL
RBC / HPF: >50 RBC/hpf (ref 0–5)
Specific Gravity, Urine: 1.016 (ref 1.005–1.030)
pH: 6 (ref 5.0–8.0)

## 2024-09-18 MED ORDER — LIDOCAINE VISCOUS HCL 2 % MT SOLN
15.0000 mL | Freq: Once | OROMUCOSAL | Status: AC
Start: 2024-09-18 — End: 2024-09-18
  Administered 2024-09-18: 15 mL via ORAL
  Filled 2024-09-18: qty 15

## 2024-09-18 MED ORDER — ONDANSETRON 4 MG PO TBDP: 4.0000 mg | ORAL_TABLET | Freq: Three times a day (TID) | ORAL | 0 refills | Status: DC | PRN

## 2024-09-18 MED ORDER — ALUM & MAG HYDROXIDE-SIMETH 200-200-20 MG/5ML PO SUSP
30.0000 mL | Freq: Once | ORAL | Status: AC
Start: 2024-09-18 — End: 2024-09-18
  Administered 2024-09-18: 30 mL via ORAL
  Filled 2024-09-18: qty 30

## 2024-09-18 MED ORDER — OXYCODONE-ACETAMINOPHEN 5-325 MG PO TABS: 1.0000 | ORAL_TABLET | ORAL | 0 refills | Status: DC | PRN

## 2024-09-18 MED ORDER — TAMSULOSIN HCL 0.4 MG PO CAPS: 0.4000 mg | ORAL_CAPSULE | Freq: Every day | ORAL | 0 refills | Status: DC

## 2024-09-18 MED ORDER — OXYCODONE-ACETAMINOPHEN 5-325 MG PO TABS
2.0000 | ORAL_TABLET | Freq: Once | ORAL | Status: AC
  Administered 2024-09-18: 2 via ORAL
  Filled 2024-09-18: qty 2

## 2024-09-18 NOTE — ED Provider Notes (Signed)
 [place] EMERGENCY DEPARTMENT AT [facility] Provider Note   CSN: [id number] Arrival date & time: 09/17/24  1952     Patient presents with: Flank Pain   Elizabeth Frank is a 33 y.o. female.   The history is provided by the patient and medical records.  Flank Pain   33 year old female with history of GERD, frequent headaches, ADHD, presenting to the ED with left flank pain.  States she woke up this morning and had a dull ache in her left side, around 5 PM this rapidly increased with severe pain and projectile vomiting.  Had several other episodes of emesis afterwards.  Pain began radiating towards her left lower abdomen and towards her groin.  She is not having any vaginal discharge or pelvic pain.  No fever or chills.  No prior history of kidney stones.  Does report a lot of pressure when urinating but denies any dysuria or hematuria.  Prior to Admission medications   Medication Sig Start Date End Date Taking? Authorizing Provider  ondansetron (ZOFRAN-ODT) 4 MG disintegrating tablet Take 1 tablet (4 mg total) by mouth every 8 (eight) hours as needed. 09/18/24  Yes Jarold Olam HERO, PA-C  oxyCODONE -acetaminophen  (PERCOCET) 5-325 MG tablet Take 1 tablet by mouth every 4 (four) hours as needed. 09/18/24  Yes Jarold Olam HERO, PA-C  tamsulosin (FLOMAX) 0.4 MG CAPS capsule Take 1 capsule (0.4 mg total) by mouth daily after supper. 09/18/24  Yes Jarold Olam HERO, PA-C  albuterol  (VENTOLIN  HFA) 108 (90 Base) MCG/ACT inhaler INHALE 2 PUFFS BY MOUTH EVERY 4 HOURS AS NEEDED FOR WHEEZING FOR SHORTNESS OF BREATH 05/26/24   Tobie Arleta SQUIBB, MD  amphetamine-dextroamphetamine (ADDERALL XR) 30 MG 24 hr capsule Take 30 mg by mouth every morning. Patient not taking: Reported on 07/28/2024 11/01/23   [provider]  Benralizumab  (FASENRA  PEN) 30 MG/ML SOAJ Inject 1 mL (30 mg total) into the skin every 28 (twenty-eight) days. Patient not taking: Reported on 07/28/2024 01/29/23   Iva Marty Saltness, MD  BREZTRI  AEROSPHERE 160-9-4.8 MCG/ACT AERO inhaler INHALE 2 PUFFS IN THE MORNING AND AT BEDTIME 06/26/24   Cheryl Reusing, FNP  Carbinoxamine  Maleate 4 MG TABS Take 1 tablet (4 mg total) by mouth 2 (two) times daily as needed (allergies). 11/22/23   Tobie Arleta SQUIBB, MD  carvedilol  (COREG ) 6.25 MG tablet Take 1 tablet (6.25 mg total) by mouth 2 (two) times daily. Patient not taking: Reported on 07/28/2024 04/25/24 07/24/24  Lorren Greig PARAS, NP  FASENRA  30 MG/ML prefilled syringe INJECT 1 SYRINGE UNDER THE SKIN EVERY 8 WEEKS Patient not taking: Reported on 07/28/2024 02/17/24   Iva Marty Saltness, MD  FLUoxetine (PROZAC) 40 MG capsule Take 80 mg by mouth daily. Patient not taking: Reported on 07/28/2024 04/07/22   [provider]  gabapentin  (NEURONTIN ) 300 MG capsule Take 1 capsule (300 mg total) by mouth 3 (three) times daily. Patient not taking: Reported on 07/28/2024 04/25/24 07/24/24  Lorren Greig PARAS, NP  guanFACINE (INTUNIV) 1 MG TB24 ER tablet Take 1 mg by mouth at bedtime. Patient not taking: Reported on 07/28/2024 04/12/23   [provider]  mometasone  (NASONEX ) 50 MCG/ACT nasal spray Place 2 sprays into the nose daily. 11/22/23   Tobie Arleta SQUIBB, MD  Olopatadine  HCl (PATADAY ) 0.2 % SOLN Place 1 drop into both eyes daily as needed. 04/23/23   Cari Arlean HERO, FNP  pantoprazole  (PROTONIX ) 40 MG tablet Take 1 tablet by mouth once daily 07/17/24   Tobie,  Arleta SQUIBB, MD  topiramate  (TOPAMAX ) 25 MG tablet Take 1 tablet (25 mg total) by mouth daily. Patient not taking: Reported on 04/25/2024 03/28/24   Lorren Greig PARAS, NP  traZODone  (DESYREL ) 50 MG tablet Take 1 tablet (50 mg total) by mouth at bedtime as needed. Patient not taking: Reported on 07/28/2024 04/25/24   Lorren Greig PARAS, NP  VYVANSE 70 MG capsule Take 70 mg by mouth every morning. Patient not taking: Reported on 11/22/2023 05/26/22   [provider]  XHANCE  93 MCG/ACT EXHU Place 1-2 sprays into the nose 2 (two) times daily as  needed. Patient not taking: Reported on 07/28/2024 11/22/23   Tobie Arleta SQUIBB, MD    Allergies: Patient has no known allergies.    Review of Systems  Gastrointestinal:  Positive for nausea and vomiting.  Genitourinary:  Positive for flank pain.  All other systems reviewed and are negative.   Updated Vital Signs BP (!) 145/79   Pulse 81   Temp (!) 97.5 F (36.4 C)   Resp 18   Ht 5' 7 (1.702 m)   Wt 122.9 kg   LMP 09/10/2024   SpO2 100%   BMI 42.44 kg/m   Physical Exam Vitals and nursing note reviewed.  Constitutional:      Appearance: She is well-developed.  HENT:     Head: Normocephalic and atraumatic.  Eyes:     Conjunctiva/sclera: Conjunctivae normal.     Pupils: Pupils are equal, round, and reactive to light.  Cardiovascular:     Rate and Rhythm: Normal rate and regular rhythm.     Heart sounds: Normal heart sounds.  Pulmonary:     Effort: Pulmonary effort is normal.     Breath sounds: Normal breath sounds.  Abdominal:     General: Bowel sounds are normal.     Palpations: Abdomen is soft.  Musculoskeletal:        General: Normal range of motion.     Cervical back: Normal range of motion.  Skin:    General: Skin is warm and dry.  Neurological:     Mental Status: She is alert and oriented to person, place, and time.     (all labs ordered are listed, but only abnormal results are displayed) Labs Reviewed  LIPASE, BLOOD - Abnormal; Notable for the following components:      Result Value   Lipase 104 (*)    All other components within normal limits  COMPREHENSIVE METABOLIC PANEL WITH GFR - Abnormal; Notable for the following components:   CO2 19 (*)    Glucose, Bld 125 (*)    Calcium 8.8 (*)    All other components within normal limits  URINALYSIS, ROUTINE W REFLEX MICROSCOPIC - Abnormal; Notable for the following components:   Hgb urine dipstick MODERATE (*)    Bacteria, UA RARE (*)    All other components within normal limits  CBC  HCG, SERUM,  QUALITATIVE    EKG: None  Radiology: CT Renal Stone Study Result Date: 09/18/2024 CLINICAL DATA:  33 year old female with abdomen and flank pain. EXAM: CT ABDOMEN AND PELVIS WITHOUT CONTRAST TECHNIQUE: Multidetector CT imaging of the abdomen and pelvis was performed following the standard protocol without IV contrast. RADIATION DOSE REDUCTION: This exam was performed according to the departmental dose-optimization program which includes automated exposure control, adjustment of the mA and/or kV according to patient size and/or use of iterative reconstruction technique. COMPARISON:  CT Abdomen and Pelvis 03/06/2015. FINDINGS: Lower chest: Normal. Hepatobiliary: Negative noncontrast liver  and gallbladder. Pancreas: Negative. Spleen: Negative. Adrenals/Urinary Tract: Normal adrenal glands. Multiple right renal calculi, ranging from punctate up to 4-5 mm (at the mid pole coronal image 54). Nonobstructed right kidney and diminutive right ureter which appears normal to the bladder. Contralateral left mild nephromegaly, mild hydronephrosis and asymmetric mild left hydroureter with periureteral stranding which continues into the pelvis. Bladder is diminutive. Small calculus on series 3, image 85 along the posteroinferior margin of the bladder is in unchanged phlebolith. But the round stone measuring 4 mm on series 3, image 83 and sagittal image 85 is just inside the bladder near the left ureterovesical junction. Stomach/Bowel: Mildly redundant large bowel. Normal appendix tracking to the midline on series 3, image 61. No large or small bowel dilatation. Noncontrast stomach and duodenum appear negative. No pneumoperitoneum or free fluid. Vascular/Lymphatic: Negative noncontrast appearance. No lymphadenopathy identified. Reproductive: Negative noncontrast appearance. Other: No pelvis free fluid. Musculoskeletal: Negative. IMPRESSION: 1. Acute obstructive uropathy on the Left with a 4 mm stone just inside the bladder  near the left UVJ. 2. No other left nephrolithiasis at this time, but multifocal right nephrolithiasis up to 4-5 mm. Electronically Signed   By: VEAR Hurst M.D.   On: 09/18/2024 04:51     Procedures   Medications Ordered in the ED  ondansetron (ZOFRAN-ODT) disintegrating tablet 4 mg (4 mg Oral Given 09/17/24 2038)  oxyCODONE -acetaminophen  (PERCOCET/ROXICET) 5-325 MG per tablet 1 tablet (1 tablet Oral Given 09/17/24 2039)  alum & mag hydroxide-simeth (MAALOX/MYLANTA) 200-200-20 MG/5ML suspension 30 mL (30 mLs Oral Given 09/18/24 0400)    And  lidocaine (XYLOCAINE) 2 % viscous mouth solution 15 mL (15 mLs Oral Given 09/18/24 0400)  oxyCODONE -acetaminophen  (PERCOCET/ROXICET) 5-325 MG per tablet 2 tablet (2 tablets Oral Given 09/18/24 0501)                                    Medical Decision Making Amount and/or Complexity of Data Reviewed Radiology: ordered and independent interpretation performed. ECG/medicine tests: ordered and independent interpretation performed.  Risk OTC drugs. Prescription drug management.   33 year old female presenting to the ED with left flank pain that began today.  Some radiation to the genital area but denies any vaginal discharge or pelvic pain.  She is afebrile and nontoxic in appearance here.  She does not really have any reproducible tenderness on exam.  Labs obtained from triage and reviewed--no leukocytosis or significant electrolyte derangement.  Lipase is elevated at 104 but she has no epigastric pain so not felt to be clinically significant.  CT stone study with left 4 mm stone at the UVJ.  Does have some stones in her right kidney as well which she was made aware of.  Patient's symptoms are managed with oral medication here.  Feel she is stable for discharge.  Will refer to urology for close follow-up.  Encouraged good water intake.  Can return here for new concerns.  Final diagnoses:  Left ureteral stone  Hematuria, unspecified type    ED Discharge  Orders          Ordered    oxyCODONE -acetaminophen  (PERCOCET) 5-325 MG tablet  Every 4 hours PRN        09/18/24 0549    tamsulosin (FLOMAX) 0.4 MG CAPS capsule  Daily after supper        09/18/24 0549    ondansetron (ZOFRAN-ODT) 4 MG disintegrating tablet  Every 8 hours PRN  09/18/24 0549               Jarold Olam HERO, PA-C 09/18/24 [id number]    Jerral Meth, MD 09/18/24 [phone]

## 2024-09-18 NOTE — Discharge Instructions (Addendum)
 As we discussed, I do have a kidney stone on your left side.  Based on location, this should pass in the next 24 to 48 hours. Take the prescribed medication as directed. Make sure to drink lots of water. Follow-up with urology if ongoing issues. Return to the ED for new or worsening symptoms.

## 2024-09-18 NOTE — ED Notes (Signed)
 Patient to CT.

## 2024-10-16 ENCOUNTER — Other Ambulatory Visit: Payer: Self-pay | Admitting: Internal Medicine

## 2024-10-16 DIAGNOSIS — K219 Gastro-esophageal reflux disease without esophagitis: Secondary | ICD-10-CM

## 2024-10-23 ENCOUNTER — Encounter: Payer: Self-pay | Admitting: Radiology

## 2024-10-25 ENCOUNTER — Encounter: Payer: Self-pay | Admitting: Nurse Practitioner

## 2024-10-25 ENCOUNTER — Ambulatory Visit: Payer: Self-pay | Admitting: Nurse Practitioner

## 2024-10-25 VITALS — BP 139/88 | HR 69 | Wt 270.6 lb

## 2024-10-25 DIAGNOSIS — J069 Acute upper respiratory infection, unspecified: Secondary | ICD-10-CM | POA: Diagnosis not present

## 2024-10-25 MED ORDER — BENZONATATE 200 MG PO CAPS: 200.0000 mg | ORAL_CAPSULE | Freq: Two times a day (BID) | ORAL | 0 refills | Status: AC | PRN

## 2024-10-25 MED ORDER — AMOXICILLIN-POT CLAVULANATE 875-125 MG PO TABS: 1.0000 | ORAL_TABLET | Freq: Two times a day (BID) | ORAL | 0 refills | Status: DC

## 2024-10-25 NOTE — Progress Notes (Signed)
 Subjective   Patient ID: Elizabeth Frank, female    DOB: 1991-11-18, 33 y.o.   MRN: [id number]  Chief Complaint  Patient presents with   Diarrhea   Sore Throat   Sinusitis   Headache   Generalized Body Aches   Cough    Has taken OTC medications for cough and congestion     Referring provider: Lorren Greig PARAS, NP  Elizabeth Frank is a 33 y.o. female with Past Medical History: No date: ADHD No date: Allergic rhinitis No date: Anxiety No date: Asthma No date: Frequent headaches No date: Hypertension No date: Recurrent sinusitis  HPI   Patient presents today for an acute visit.  She has been having diarrhea, sore throat, sinus congestion, headache, body aches, cough for the past week.  She has taken over-the-counter medications for cough and congestion.  This has provided minimal relief.  COVID and flu test were both negative in office today.  Denies f/c/s, n/v/d, hemoptysis, PND, leg swelling Denies chest pain or edema.    No Known Allergies   There is no immunization history on file for this patient.  Tobacco History: Social History   Tobacco Use  Smoking Status Never   Passive exposure: Never  Smokeless Tobacco Never   Counseling given: Not Answered   Outpatient Encounter Medications as of 10/25/2024  Medication Sig   albuterol  (VENTOLIN  HFA) 108 (90 Base) MCG/ACT inhaler INHALE 2 PUFFS BY MOUTH EVERY 4 HOURS AS NEEDED FOR WHEEZING FOR SHORTNESS OF BREATH   amoxicillin -clavulanate (AUGMENTIN ) 875-125 MG tablet Take 1 tablet by mouth 2 (two) times daily.   benzonatate  (TESSALON ) 200 MG capsule Take 1 capsule (200 mg total) by mouth 2 (two) times daily as needed for cough.   Carbinoxamine  Maleate 4 MG TABS Take 1 tablet (4 mg total) by mouth 2 (two) times daily as needed (allergies).   carvedilol  (COREG ) 6.25 MG tablet Take 1 tablet (6.25 mg total) by mouth 2 (two) times daily.   FLUoxetine (PROZAC) 40 MG capsule Take 80 mg by mouth daily.   pantoprazole   (PROTONIX ) 40 MG tablet Take 1 tablet by mouth once daily   amphetamine-dextroamphetamine (ADDERALL XR) 30 MG 24 hr capsule Take 30 mg by mouth every morning. (Patient not taking: Reported on 10/25/2024)   Benralizumab  (FASENRA  PEN) 30 MG/ML SOAJ Inject 1 mL (30 mg total) into the skin every 28 (twenty-eight) days. (Patient not taking: Reported on 10/25/2024)   BREZTRI  AEROSPHERE 160-9-4.8 MCG/ACT AERO inhaler INHALE 2 PUFFS IN THE MORNING AND AT BEDTIME (Patient not taking: Reported on 10/25/2024)   FASENRA  30 MG/ML prefilled syringe INJECT 1 SYRINGE UNDER THE SKIN EVERY 8 WEEKS (Patient not taking: Reported on 10/25/2024)   gabapentin  (NEURONTIN ) 300 MG capsule Take 1 capsule (300 mg total) by mouth 3 (three) times daily. (Patient not taking: Reported on 10/25/2024)   guanFACINE (INTUNIV) 1 MG TB24 ER tablet Take 1 mg by mouth at bedtime. (Patient not taking: Reported on 10/25/2024)   mometasone  (NASONEX ) 50 MCG/ACT nasal spray Place 2 sprays into the nose daily. (Patient not taking: Reported on 10/25/2024)   Olopatadine  HCl (PATADAY ) 0.2 % SOLN Place 1 drop into both eyes daily as needed. (Patient not taking: Reported on 10/25/2024)   ondansetron (ZOFRAN-ODT) 4 MG disintegrating tablet Take 1 tablet (4 mg total) by mouth every 8 (eight) hours as needed. (Patient not taking: Reported on 10/25/2024)   oxyCODONE -acetaminophen  (PERCOCET) 5-325 MG tablet Take 1 tablet by mouth every 4 (four) hours as needed. (Patient  not taking: Reported on 10/25/2024)   tamsulosin (FLOMAX) 0.4 MG CAPS capsule Take 1 capsule (0.4 mg total) by mouth daily after supper. (Patient not taking: Reported on 10/25/2024)   topiramate  (TOPAMAX ) 25 MG tablet Take 1 tablet (25 mg total) by mouth daily. (Patient not taking: Reported on 10/25/2024)   traZODone  (DESYREL ) 50 MG tablet Take 1 tablet (50 mg total) by mouth at bedtime as needed. (Patient not taking: Reported on 10/25/2024)   VYVANSE 70 MG capsule Take 70 mg by mouth every morning.  (Patient not taking: Reported on 11/22/2023)   XHANCE  93 MCG/ACT EXHU Place 1-2 sprays into the nose 2 (two) times daily as needed. (Patient not taking: Reported on 10/25/2024)   Facility-Administered Encounter Medications as of 10/25/2024  Medication   Benralizumab  SOSY 30 mg    Review of Systems  Review of Systems  Constitutional: Negative.   HENT:  Positive for congestion, postnasal drip, sinus pressure, sinus pain and sore throat.   Respiratory:  Positive for cough.   Cardiovascular: Negative.   Gastrointestinal: Negative.   Allergic/Immunologic: Negative.   Neurological: Negative.   Psychiatric/Behavioral: Negative.       Objective:   BP 139/88 (BP Location: Left Arm, Patient Position: Sitting, Cuff Size: Large)   Pulse 69   Wt 270 lb 9.6 oz (122.7 kg)   SpO2 100%   BMI 42.38 kg/m   Wt Readings from Last 5 Encounters:  10/25/24 270 lb 9.6 oz (122.7 kg)  09/17/24 270 lb 15.1 oz (122.9 kg)  07/28/24 271 lb (122.9 kg)  04/25/24 255 lb 3.2 oz (115.8 kg)  03/24/24 244 lb 9.6 oz (110.9 kg)     Physical Exam Vitals and nursing note reviewed.  Constitutional:      General: She is not in acute distress.    Appearance: She is well-developed.  HENT:     Nose: Congestion present.  Cardiovascular:     Rate and Rhythm: Regular rhythm.  Pulmonary:     Effort: Pulmonary effort is normal.     Breath sounds: Normal breath sounds.  Neurological:     Mental Status: She is alert and oriented to person, place, and time.       Assessment & Plan:   Upper respiratory tract infection, unspecified type -     Amoxicillin -Pot Clavulanate; Take 1 tablet by mouth 2 (two) times daily.  Dispense: 20 tablet; Refill: 0  Other orders -     Benzonatate ; Take 1 capsule (200 mg total) by mouth 2 (two) times daily as needed for cough.  Dispense: 20 capsule; Refill: 0     Return if symptoms worsen or fail to improve.   Bascom GORMAN Borer, NP 10/25/2024

## 2024-11-09 ENCOUNTER — Other Ambulatory Visit: Payer: Self-pay | Admitting: Emergency Medicine

## 2024-11-09 DIAGNOSIS — I10 Essential (primary) hypertension: Secondary | ICD-10-CM

## 2024-11-09 NOTE — Telephone Encounter (Signed)
 Patient requesting a refill

## 2024-11-10 MED ORDER — CARVEDILOL 6.25 MG PO TABS: 6.2500 mg | ORAL_TABLET | Freq: Two times a day (BID) | ORAL | 0 refills | Status: AC

## 2024-11-10 NOTE — Telephone Encounter (Signed)
 Complete

## 2024-12-22 ENCOUNTER — Telehealth: Payer: Self-pay | Admitting: Allergy & Immunology

## 2024-12-22 NOTE — Telephone Encounter (Signed)
 Called to schedule Fasenra  reapproval appointment. Voicemail is not set up, could not leave message.

## 2025-01-16 ENCOUNTER — Other Ambulatory Visit: Payer: Self-pay | Admitting: Family

## 2025-01-16 ENCOUNTER — Telehealth: Admitting: Emergency Medicine

## 2025-01-16 DIAGNOSIS — J111 Influenza due to unidentified influenza virus with other respiratory manifestations: Secondary | ICD-10-CM | POA: Diagnosis not present

## 2025-01-16 DIAGNOSIS — J45909 Unspecified asthma, uncomplicated: Secondary | ICD-10-CM | POA: Diagnosis not present

## 2025-01-16 DIAGNOSIS — K219 Gastro-esophageal reflux disease without esophagitis: Secondary | ICD-10-CM

## 2025-01-16 DIAGNOSIS — B349 Viral infection, unspecified: Secondary | ICD-10-CM

## 2025-01-16 MED ORDER — OSELTAMIVIR PHOSPHATE 75 MG PO CAPS: 75.0000 mg | ORAL_CAPSULE | Freq: Two times a day (BID) | ORAL | 0 refills | Status: AC

## 2025-01-16 MED ORDER — ALBUTEROL SULFATE HFA 108 (90 BASE) MCG/ACT IN AERS: 1.0000 | INHALATION_SPRAY | RESPIRATORY_TRACT | 0 refills | Status: AC | PRN

## 2025-01-16 NOTE — Patient Instructions (Addendum)
 " Elizabeth Frank, thank you for joining [name], NP for today's virtual visit.  While this provider is not your primary care provider (PCP), if your PCP is located in our provider database this encounter information will be shared with them immediately following your visit.   A [facility] MyChart account gives you access to today's visit and all your visits, tests, and labs performed at [facility]  click here if you don't have a [facility] MyChart account or go to [email or web address]  Consent: (Patient) Elizabeth Frank provided verbal consent for this virtual visit at the beginning of the encounter.  Current Medications:  Current Outpatient Medications:    oseltamivir  (TAMIFLU ) 75 MG capsule, Take 1 capsule (75 mg total) by mouth 2 (two) times daily., Disp: 10 capsule, Rfl: 0   albuterol  (VENTOLIN  HFA) 108 (90 Base) MCG/ACT inhaler, Inhale 1-2 puffs into the lungs every 4 (four) hours as needed for wheezing or shortness of breath., Disp: 9 g, Rfl: 0   amoxicillin -clavulanate (AUGMENTIN ) 875-125 MG tablet, Take 1 tablet by mouth 2 (two) times daily., Disp: 20 tablet, Rfl: 0   amphetamine-dextroamphetamine (ADDERALL XR) 30 MG 24 hr capsule, Take 30 mg by mouth every morning. (Patient not taking: Reported on 10/25/2024), Disp: , Rfl:    Benralizumab  (FASENRA  PEN) 30 MG/ML SOAJ, Inject 1 mL (30 mg total) into the skin every 28 (twenty-eight) days. (Patient not taking: Reported on 10/25/2024), Disp: 1 mL, Rfl: 11   benzonatate  (TESSALON ) 200 MG capsule, Take 1 capsule (200 mg total) by mouth 2 (two) times daily as needed for cough., Disp: 20 capsule, Rfl: 0   BREZTRI  AEROSPHERE 160-9-4.8 MCG/ACT AERO inhaler, INHALE 2 PUFFS IN THE MORNING AND AT BEDTIME (Patient not taking: Reported on 10/25/2024), Disp: 11 g, Rfl: 0   Carbinoxamine  Maleate 4 MG TABS, Take 1 tablet (4 mg total) by mouth 2 (two) times daily as needed (allergies)., Disp: 190 tablet, Rfl: 1   carvedilol  (COREG )  6.25 MG tablet, Take 1 tablet (6.25 mg total) by mouth 2 (two) times daily., Disp: 180 tablet, Rfl: 0   FASENRA  30 MG/ML prefilled syringe, INJECT 1 SYRINGE UNDER THE SKIN EVERY 8 WEEKS (Patient not taking: Reported on 10/25/2024), Disp: 1 mL, Rfl: 6   FLUoxetine (PROZAC) 40 MG capsule, Take 80 mg by mouth daily., Disp: , Rfl:    gabapentin  (NEURONTIN ) 300 MG capsule, Take 1 capsule (300 mg total) by mouth 3 (three) times daily. (Patient not taking: Reported on 10/25/2024), Disp: 90 capsule, Rfl: 2   guanFACINE (INTUNIV) 1 MG TB24 ER tablet, Take 1 mg by mouth at bedtime. (Patient not taking: Reported on 10/25/2024), Disp: , Rfl:    mometasone  (NASONEX ) 50 MCG/ACT nasal spray, Place 2 sprays into the nose daily. (Patient not taking: Reported on 10/25/2024), Disp: 1 each, Rfl: 5   Olopatadine  HCl (PATADAY ) 0.2 % SOLN, Place 1 drop into both eyes daily as needed. (Patient not taking: Reported on 10/25/2024), Disp: 2.5 mL, Rfl: 3   ondansetron  (ZOFRAN -ODT) 4 MG disintegrating tablet, Take 1 tablet (4 mg total) by mouth every 8 (eight) hours as needed. (Patient not taking: Reported on 10/25/2024), Disp: 10 tablet, Rfl: 0   oxyCODONE -acetaminophen  (PERCOCET) 5-325 MG tablet, Take 1 tablet by mouth every 4 (four) hours as needed. (Patient not taking: Reported on 10/25/2024), Disp: 12 tablet, Rfl: 0   pantoprazole  (PROTONIX ) 40 MG tablet, Take 1 tablet by mouth once daily, Disp: 90 tablet, Rfl: 0   tamsulosin  (FLOMAX ) 0.4  MG CAPS capsule, Take 1 capsule (0.4 mg total) by mouth daily after supper. (Patient not taking: Reported on 10/25/2024), Disp: 10 capsule, Rfl: 0   topiramate  (TOPAMAX ) 25 MG tablet, Take 1 tablet (25 mg total) by mouth daily. (Patient not taking: Reported on 10/25/2024), Disp: 30 tablet, Rfl: 2   traZODone  (DESYREL ) 50 MG tablet, Take 1 tablet (50 mg total) by mouth at bedtime as needed. (Patient not taking: Reported on 10/25/2024), Disp: 30 tablet, Rfl: 2   VYVANSE 70 MG capsule, Take 70 mg by mouth  every morning. (Patient not taking: Reported on 11/22/2023), Disp: , Rfl:    XHANCE  93 MCG/ACT EXHU, Place 1-2 sprays into the nose 2 (two) times daily as needed. (Patient not taking: Reported on 10/25/2024), Disp: 32 mL, Rfl: 5  Current Facility-Administered Medications:    Benralizumab  SOSY 30 mg, 30 mg, Subcutaneous, Q28 days, Iva Marty Saltness, MD, 30 mg at 03/20/24 1441   Medications ordered in this encounter:  Meds ordered this encounter  Medications   albuterol  (VENTOLIN  HFA) 108 (90 Base) MCG/ACT inhaler    Sig: Inhale 1-2 puffs into the lungs every 4 (four) hours as needed for wheezing or shortness of breath.    Dispense:  9 g    Refill:  0   oseltamivir  (TAMIFLU ) 75 MG capsule    Sig: Take 1 capsule (75 mg total) by mouth 2 (two) times daily.    Dispense:  10 capsule    Refill:  0     *If you need refills on other medications prior to your next appointment, please contact your pharmacy*  Follow-Up: Call back or seek an in-person evaluation if the symptoms worsen or if the condition fails to improve as anticipated.  [facility] [phone]  Other Instructions  Continue using mucinex and use tylenol  or ibuprofen  as directed on the package for pain or fever.   I suspect you may have the flu and I have prescribed the flu medicine Tamiflu  for you.   Use your albuterol  inhaler if needed for shortness of breath or wheezing. If it is not working, and you are having continued wheezing or shortness of breath, please be seen in person for help.    If you have been instructed to have an in-person evaluation today at a local Urgent Care facility, please use the link below. It will take you to a list of all of our available [facility], including address, phone number and hours of operation. Please do not delay care.  [facility]  If you or a family member do not have a primary care provider, use the link below to schedule a visit and  establish care. When you choose a [facility] primary care physician or advanced practice provider, you gain a long-term partner in health. Find a Primary Care Provider  Learn more about [facility]'s in-office and virtual care options: [facility] - Get Care Now  "

## 2025-01-16 NOTE — Progress Notes (Signed)
 " Virtual Visit Consent   Elizabeth Frank, you are scheduled for a virtual visit with a [facility] provider today. Just as with appointments in the office, your consent must be obtained to participate. Your consent will be active for this visit and any virtual visit you may have with one of our providers in the next 365 days. If you have a MyChart account, a copy of this consent can be sent to you electronically.  As this is a virtual visit, video technology does not allow for your provider to perform a traditional examination. This may limit your provider's ability to fully assess your condition. If your provider identifies any concerns that need to be evaluated in person or the need to arrange testing (such as labs, EKG, etc.), we will make arrangements to do so. Although advances in technology are sophisticated, we cannot ensure that it will always work on either your end or our end. If the connection with a video visit is poor, the visit may have to be switched to a telephone visit. With either a video or telephone visit, we are not always able to ensure that we have a secure connection.  By engaging in this virtual visit, you consent to the provision of healthcare and authorize for your insurance to be billed (if applicable) for the services provided during this visit. Depending on your insurance coverage, you may receive a charge related to this service.  I need to obtain your verbal consent now. Are you willing to proceed with your visit today? Elizabeth Frank has provided verbal consent on 01/16/2025 for a virtual visit (video or telephone). [name], NP  Date: 01/16/2025 6:30 PM   Virtual Visit via Video Note   I, [name], connected with  Elizabeth Frank  ([id number], 1991/08/01) on 01/16/25 at  6:15 PM EST by a video-enabled telemedicine application and verified that I am speaking with the correct person using two identifiers.  Location: Patient: Virtual Visit Location Patient:  Home Provider: Virtual Visit Location Provider: Home Office   I discussed the limitations of evaluation and management by telemedicine and the availability of in person appointments. The patient expressed understanding and agreed to proceed.    History of Present Illness: Elizabeth Frank is a 34 y.o. who identifies as a female who was assigned female at birth, and is being seen today for illness  Had chest pain on right chest plus a scratchy throat  2 days ago. That went away completely. Then yesterday abrupt onset cough and nasal congestion. Productive cough for clear sputum. Has body aches and headache. Fever yesterday 100.49F; no fever today as she is taking ibuprofen .   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   Taking mucinex and ibuprofen , last dose about 4 hours ago.   Wheezing intermittently; not wheezing or SOB right now. Lost inhaler, requests new one.   HPI: HPI  Problems:  Patient Active Problem List   Diagnosis Date Noted   Not well controlled severe persistent asthma (HCC) 04/25/2023   Dysfunction of both eustachian tubes 04/25/2023   Moderate persistent asthma without complication 07/17/2022   Seasonal and perennial allergic rhinitis 07/17/2022   Recurrent infections 07/17/2022   Gastroesophageal reflux disease 07/17/2022   Seasonal allergic conjunctivitis 07/17/2022   Encounter for gynecological examination with Papanicolaou smear of cervix 05/20/2021   Elevated BP without diagnosis of hypertension 05/20/2021   History of abnormal Pap  smear 04/17/2013    Allergies: Allergies[1] Medications: Current Medications[2]  Observations/Objective: Patient is well-developed, well-nourished in no acute distress.  Resting comfortably  at home.  Head is normocephalic, atraumatic.  No labored breathing.  Speech is clear and coherent with logical content.  Patient is alert and oriented at baseline.    Assessment and  Plan: 1. Viral infection (Primary) - oseltamivir  (TAMIFLU ) 75 MG capsule; Take 1 capsule (75 mg total) by mouth 2 (two) times daily.  Dispense: 10 capsule; Refill: 0  2. Influenza-like illness  3. Asthma, unspecified asthma severity, unspecified whether complicated, unspecified whether persistent - albuterol  (VENTOLIN  HFA) 108 (90 Base) MCG/ACT inhaler; Inhale 1-2 puffs into the lungs every 4 (four) hours as needed for wheezing or shortness of breath.  Dispense: 9 g; Refill: 0  Possible influenza. Will empirically treat. I have refilled her albuterol . Continue mucinex and ibuprofen  at home.   Follow Up Instructions: I discussed the assessment and treatment plan with the patient. The patient was provided an opportunity to ask questions and all were answered. The patient agreed with the plan and demonstrated an understanding of the instructions.  A copy of instructions were sent to the patient via MyChart unless otherwise noted below.   The patient was advised to call back or seek an in-person evaluation if the symptoms worsen or if the condition fails to improve as anticipated.    [name], NP     [1] No Known Allergies [2]  Current Outpatient Medications:    oseltamivir  (TAMIFLU ) 75 MG capsule, Take 1 capsule (75 mg total) by mouth 2 (two) times daily., Disp: 10 capsule, Rfl: 0   albuterol  (VENTOLIN  HFA) 108 (90 Base) MCG/ACT inhaler, Inhale 1-2 puffs into the lungs every 4 (four) hours as needed for wheezing or shortness of breath., Disp: 9 g, Rfl: 0   amoxicillin -clavulanate (AUGMENTIN ) 875-125 MG tablet, Take 1 tablet by mouth 2 (two) times daily., Disp: 20 tablet, Rfl: 0   amphetamine-dextroamphetamine (ADDERALL XR) 30 MG 24 hr capsule, Take 30 mg by mouth every morning. (Patient not taking: Reported on 10/25/2024), Disp: , Rfl:    Benralizumab  (FASENRA  PEN) 30 MG/ML SOAJ, Inject 1 mL (30 mg total) into the skin every 28 (twenty-eight) days. (Patient not taking: Reported on  10/25/2024), Disp: 1 mL, Rfl: 11   benzonatate  (TESSALON ) 200 MG capsule, Take 1 capsule (200 mg total) by mouth 2 (two) times daily as needed for cough., Disp: 20 capsule, Rfl: 0   BREZTRI  AEROSPHERE 160-9-4.8 MCG/ACT AERO inhaler, INHALE 2 PUFFS IN THE MORNING AND AT BEDTIME (Patient not taking: Reported on 10/25/2024), Disp: 11 g, Rfl: 0   Carbinoxamine  Maleate 4 MG TABS, Take 1 tablet (4 mg total) by mouth 2 (two) times daily as needed (allergies)., Disp: 190 tablet, Rfl: 1   carvedilol  (COREG ) 6.25 MG tablet, Take 1 tablet (6.25 mg total) by mouth 2 (two) times daily., Disp: 180 tablet, Rfl: 0   FASENRA  30 MG/ML prefilled syringe, INJECT 1 SYRINGE UNDER THE SKIN EVERY 8 WEEKS (Patient not taking: Reported on 10/25/2024), Disp: 1 mL, Rfl: 6   FLUoxetine (PROZAC) 40 MG capsule, Take 80 mg by mouth daily., Disp: , Rfl:    gabapentin  (NEURONTIN ) 300 MG capsule, Take 1 capsule (300 mg total) by mouth 3 (three) times daily. (Patient not taking: Reported on 10/25/2024), Disp: 90 capsule, Rfl: 2   guanFACINE (INTUNIV) 1 MG TB24 ER tablet, Take 1 mg by mouth at bedtime. (Patient not taking: Reported on 10/25/2024), Disp: ,  Rfl:    mometasone  (NASONEX ) 50 MCG/ACT nasal spray, Place 2 sprays into the nose daily. (Patient not taking: Reported on 10/25/2024), Disp: 1 each, Rfl: 5   Olopatadine  HCl (PATADAY ) 0.2 % SOLN, Place 1 drop into both eyes daily as needed. (Patient not taking: Reported on 10/25/2024), Disp: 2.5 mL, Rfl: 3   ondansetron  (ZOFRAN -ODT) 4 MG disintegrating tablet, Take 1 tablet (4 mg total) by mouth every 8 (eight) hours as needed. (Patient not taking: Reported on 10/25/2024), Disp: 10 tablet, Rfl: 0   oxyCODONE -acetaminophen  (PERCOCET) 5-325 MG tablet, Take 1 tablet by mouth every 4 (four) hours as needed. (Patient not taking: Reported on 10/25/2024), Disp: 12 tablet, Rfl: 0   pantoprazole  (PROTONIX ) 40 MG tablet, Take 1 tablet by mouth once daily, Disp: 90 tablet, Rfl: 0   tamsulosin  (FLOMAX ) 0.4  MG CAPS capsule, Take 1 capsule (0.4 mg total) by mouth daily after supper. (Patient not taking: Reported on 10/25/2024), Disp: 10 capsule, Rfl: 0   topiramate  (TOPAMAX ) 25 MG tablet, Take 1 tablet (25 mg total) by mouth daily. (Patient not taking: Reported on 10/25/2024), Disp: 30 tablet, Rfl: 2   traZODone  (DESYREL ) 50 MG tablet, Take 1 tablet (50 mg total) by mouth at bedtime as needed. (Patient not taking: Reported on 10/25/2024), Disp: 30 tablet, Rfl: 2   VYVANSE 70 MG capsule, Take 70 mg by mouth every morning. (Patient not taking: Reported on 11/22/2023), Disp: , Rfl:    XHANCE  93 MCG/ACT EXHU, Place 1-2 sprays into the nose 2 (two) times daily as needed. (Patient not taking: Reported on 10/25/2024), Disp: 32 mL, Rfl: 5  Current Facility-Administered Medications:    Benralizumab  SOSY 30 mg, 30 mg, Subcutaneous, Q28 days, Iva Marty Saltness, MD, 30 mg at 03/20/24 1441  "

## 2025-01-26 ENCOUNTER — Ambulatory Visit: Payer: Self-pay

## 2025-04-25 ENCOUNTER — Encounter: Admitting: Family
